# Patient Record
Sex: Female | Born: 1990 | Hispanic: Yes | Marital: Single | Smoking: Never smoker
Health system: Southern US, Community
[De-identification: ages and names within clinical notes are randomized; demographics above are authoritative.]

## PROBLEM LIST (undated history)

## (undated) DIAGNOSIS — Z789 Other specified health status: Secondary | ICD-10-CM

## (undated) HISTORY — PX: NO PAST SURGERIES: SHX2092

---

## 2019-11-03 HISTORY — PX: COLONOSCOPY: SHX174

## 2020-01-18 ENCOUNTER — Encounter (HOSPITAL_COMMUNITY): Payer: Self-pay

## 2020-01-18 ENCOUNTER — Ambulatory Visit (HOSPITAL_COMMUNITY)
Admission: EM | Admit: 2020-01-18 | Discharge: 2020-01-18 | Disposition: A | Discharge status: Home / Self Care | Payer: Self-pay | Attending: Urgent Care | Admitting: Urgent Care

## 2020-01-18 DIAGNOSIS — K5900 Constipation, unspecified: Secondary | ICD-10-CM

## 2020-01-18 DIAGNOSIS — Z3202 Encounter for pregnancy test, result negative: Secondary | ICD-10-CM

## 2020-01-18 DIAGNOSIS — R1032 Left lower quadrant pain: Secondary | ICD-10-CM

## 2020-01-18 LAB — POCT URINALYSIS DIP (DEVICE)
Bilirubin Urine: NEGATIVE
Glucose, UA: NEGATIVE mg/dL
Ketones, ur: NEGATIVE mg/dL
Leukocytes,Ua: NEGATIVE
Nitrite: NEGATIVE
Protein, ur: NEGATIVE mg/dL
Specific Gravity, Urine: 1.015 (ref 1.005–1.030)
Urobilinogen, UA: 0.2 mg/dL (ref 0.0–1.0)
pH: 7 (ref 5.0–8.0)

## 2020-01-18 LAB — POC URINE PREG, ED: Preg Test, Ur: NEGATIVE

## 2020-01-18 LAB — POCT PREGNANCY, URINE: Preg Test, Ur: NEGATIVE

## 2020-01-18 MED ORDER — DOCUSATE SODIUM 100 MG PO CAPS: 100.0000 mg | ORAL_CAPSULE | Freq: Two times a day (BID) | ORAL | 0 refills | Status: DC

## 2020-01-18 NOTE — ED Triage Notes (Signed)
Complains of abdominal pain on the left side for two days

## 2020-01-18 NOTE — ED Provider Notes (Signed)
[facility]   MRN: [id number] DOB: 08/18/1991  Subjective:   Amber Torres is a 29 y.o. female presenting for 2-day history of recurrent intermittent left-sided abdominal pain.  Patient states that she has difficulty with constipation, normally defecates 1-2 times a week.  Admits that she does not eat fiber, practices a carb heavy diet.  She does hydrate well.  Denies ROS as below.  Last bowel movement was this morning, was a hard stool and patient had to strain.  She is not currently taking any medications and has no known food or drug allergies.  Denies past medical and surgical history.  No family history on file.  Social History   Tobacco Use  . Smoking status: Never Smoker  . Smokeless tobacco: Never Used  Substance Use Topics  . Alcohol use: Never  . Drug use: Not on file    Review of Systems  Constitutional: Negative for fever and malaise/fatigue.  HENT: Negative for congestion, ear pain, sinus pain and sore throat.   Eyes: Negative for blurred vision, double vision, discharge and redness.  Respiratory: Negative for cough, hemoptysis, shortness of breath and wheezing.   Cardiovascular: Negative for chest pain.  Gastrointestinal: Positive for abdominal pain and constipation. Negative for blood in stool, diarrhea, nausea and vomiting.  Genitourinary: Negative for dysuria, flank pain and hematuria.  Musculoskeletal: Negative for myalgias.  Skin: Negative for rash.  Neurological: Negative for dizziness, weakness and headaches.  Psychiatric/Behavioral: Negative for depression and substance abuse.     Objective:   Vitals: BP 109/76 (BP Location: Right Arm)   Pulse 92   Temp 97.9 F (36.6 C) (Oral)   Resp 16   Wt 116 lb (52.6 kg)   LMP 01/01/2020 (Exact Date)   SpO2 98%   Physical Exam Constitutional:      General: She is not in acute distress.    Appearance: Normal appearance. She is well-developed and normal weight. She is not ill-appearing,  toxic-appearing or diaphoretic.  HENT:     Head: Normocephalic and atraumatic.     Right Ear: External ear normal.     Left Ear: External ear normal.     Nose: Nose normal.     Mouth/Throat:     Mouth: Mucous membranes are moist.     Pharynx: Oropharynx is clear.  Eyes:     General: No scleral icterus.       Right eye: No discharge.        Left eye: No discharge.     Extraocular Movements: Extraocular movements intact.     Pupils: Pupils are equal, round, and reactive to light.  Cardiovascular:     Rate and Rhythm: Normal rate and regular rhythm.     Heart sounds: Normal heart sounds. No murmur. No friction rub. No gallop.   Pulmonary:     Effort: Pulmonary effort is normal. No respiratory distress.     Breath sounds: Normal breath sounds. No stridor. No wheezing, rhonchi or rales.  Abdominal:     General: Bowel sounds are normal. There is no distension.     Palpations: Abdomen is soft. There is no mass.     Tenderness: There is abdominal tenderness (left sided, stool burden palpated). There is no right CVA tenderness, left CVA tenderness, guarding or rebound.  Skin:    General: Skin is warm and dry.     Coloration: Skin is not pale.     Findings: No rash.  Neurological:     General: No focal deficit  present.     Mental Status: She is alert and oriented to person, place, and time.  Psychiatric:        Mood and Affect: Mood normal.        Behavior: Behavior normal.        Thought Content: Thought content normal.        Judgment: Judgment normal.     Results for orders placed or performed during the hospital encounter of 01/18/20 (from the past 24 hour(s))  POCT urinalysis dip (device)     Status: Abnormal   Collection Time: 01/18/20  3:29 PM  Result Value Ref Range   Glucose, UA NEGATIVE NEGATIVE mg/dL   Bilirubin Urine NEGATIVE NEGATIVE   Ketones, ur NEGATIVE NEGATIVE mg/dL   Specific Gravity, Urine 1.015 1.005 - 1.030   Hgb urine dipstick TRACE (A) NEGATIVE   pH 7.0  5.0 - 8.0   Protein, ur NEGATIVE NEGATIVE mg/dL   Urobilinogen, UA 0.2 0.0 - 1.0 mg/dL   Nitrite NEGATIVE NEGATIVE   Leukocytes,Ua NEGATIVE NEGATIVE  POC urine pregnancy     Status: None   Collection Time: 01/18/20  4:30 PM  Result Value Ref Range   Preg Test, Ur NEGATIVE NEGATIVE  Pregnancy, urine POC     Status: None   Collection Time: 01/18/20  4:30 PM  Result Value Ref Range   Preg Test, Ur NEGATIVE NEGATIVE    Assessment and Plan :   1. Constipation, unspecified constipation type   2. Left lower quadrant abdominal pain     Start docusate 100 mg twice daily.  Recommended significant dietary modifications including increased fiber with salads, vegetables and healthy fruits. Counseled patient on potential for adverse effects with medications prescribed/recommended today, ER and return-to-clinic precautions discussed, patient verbalized understanding.    Jaynee Eagles, PA-C 01/18/20 1700

## 2020-10-17 ENCOUNTER — Other Ambulatory Visit: Payer: Self-pay

## 2020-10-17 ENCOUNTER — Ambulatory Visit (HOSPITAL_COMMUNITY)
Admission: EM | Admit: 2020-10-17 | Discharge: 2020-10-17 | Disposition: A | Discharge status: Home / Self Care | Payer: Self-pay

## 2020-10-18 ENCOUNTER — Other Ambulatory Visit: Payer: Self-pay

## 2020-10-18 DIAGNOSIS — Z20822 Contact with and (suspected) exposure to covid-19: Secondary | ICD-10-CM

## 2020-10-22 LAB — NOVEL CORONAVIRUS, NAA: SARS-CoV-2, NAA: NOT DETECTED

## 2021-03-15 ENCOUNTER — Inpatient Hospital Stay (HOSPITAL_COMMUNITY)
Admission: AD | Admit: 2021-03-15 | Discharge: 2021-03-15 | Disposition: A | Discharge status: Home / Self Care | Payer: Managed Care, Other (non HMO) | Attending: Obstetrics & Gynecology | Admitting: Obstetrics & Gynecology

## 2021-03-15 ENCOUNTER — Other Ambulatory Visit: Payer: Self-pay | Admitting: Obstetrics and Gynecology

## 2021-03-15 ENCOUNTER — Inpatient Hospital Stay (HOSPITAL_COMMUNITY): Payer: Managed Care, Other (non HMO)

## 2021-03-15 ENCOUNTER — Other Ambulatory Visit: Payer: Self-pay

## 2021-03-15 ENCOUNTER — Encounter (HOSPITAL_COMMUNITY): Payer: Self-pay | Admitting: Obstetrics & Gynecology

## 2021-03-15 ENCOUNTER — Telehealth: Payer: Self-pay | Admitting: Obstetrics and Gynecology

## 2021-03-15 DIAGNOSIS — Z3A01 Less than 8 weeks gestation of pregnancy: Secondary | ICD-10-CM

## 2021-03-15 DIAGNOSIS — B373 Candidiasis of vulva and vagina: Secondary | ICD-10-CM | POA: Diagnosis not present

## 2021-03-15 DIAGNOSIS — R109 Unspecified abdominal pain: Secondary | ICD-10-CM | POA: Insufficient documentation

## 2021-03-15 DIAGNOSIS — O98811 Other maternal infectious and parasitic diseases complicating pregnancy, first trimester: Secondary | ICD-10-CM

## 2021-03-15 DIAGNOSIS — B9689 Other specified bacterial agents as the cause of diseases classified elsewhere: Secondary | ICD-10-CM

## 2021-03-15 DIAGNOSIS — Z789 Other specified health status: Secondary | ICD-10-CM | POA: Diagnosis not present

## 2021-03-15 DIAGNOSIS — O3680X0 Pregnancy with inconclusive fetal viability, not applicable or unspecified: Secondary | ICD-10-CM | POA: Diagnosis not present

## 2021-03-15 DIAGNOSIS — O26891 Other specified pregnancy related conditions, first trimester: Secondary | ICD-10-CM | POA: Insufficient documentation

## 2021-03-15 DIAGNOSIS — N760 Acute vaginitis: Secondary | ICD-10-CM

## 2021-03-15 DIAGNOSIS — B3731 Acute candidiasis of vulva and vagina: Secondary | ICD-10-CM

## 2021-03-15 HISTORY — DX: Other specified health status: Z78.9

## 2021-03-15 LAB — CBC
HCT: 36.8 % (ref 36.0–46.0)
Hemoglobin: 12.6 g/dL (ref 12.0–15.0)
MCH: 31 pg (ref 26.0–34.0)
MCHC: 34.2 g/dL (ref 30.0–36.0)
MCV: 90.6 fL (ref 80.0–100.0)
Platelets: 274 10*3/uL (ref 150–400)
RBC: 4.06 MIL/uL (ref 3.87–5.11)
RDW: 12.9 % (ref 11.5–15.5)
WBC: 7.3 10*3/uL (ref 4.0–10.5)
nRBC: 0 % (ref 0.0–0.2)

## 2021-03-15 LAB — HIV ANTIBODY (ROUTINE TESTING W REFLEX): HIV Screen 4th Generation wRfx: NONREACTIVE

## 2021-03-15 LAB — WET PREP, GENITAL
Sperm: NONE SEEN
Trich, Wet Prep: NONE SEEN
Yeast Wet Prep HPF POC: NONE SEEN

## 2021-03-15 LAB — POCT PREGNANCY, URINE: Preg Test, Ur: POSITIVE — AB

## 2021-03-15 LAB — HCG, QUANTITATIVE, PREGNANCY: hCG, Beta Chain, Quant, S: 374 m[IU]/mL — ABNORMAL HIGH (ref <5)

## 2021-03-15 MED ORDER — TERCONAZOLE 0.4 % VA CREA: 1.0000 | TOPICAL_CREAM | Freq: Every day | VAGINAL | 0 refills | Status: AC

## 2021-03-15 MED ORDER — METRONIDAZOLE 500 MG PO TABS: 500.0000 mg | ORAL_TABLET | Freq: Two times a day (BID) | ORAL | 0 refills | Status: DC

## 2021-03-15 NOTE — MAU Provider Note (Signed)
History     CSN: [id number]  Arrival date and time: 03/15/21 1443   Event Date/Time   First Provider Initiated Contact with Patient 03/15/21 1901      Chief Complaint  Patient presents with  . Abdominal Pain  . Possible Pregnancy   [name] is a 30 y.o. year old G1P0 female at [redacted]w[redacted]d weeks gestation who presents to MAU reporting left lower abdominal pain in the setting of 1 (+) and 1 (-) HPT. She denies any vaginal bleeding. She wants to confirm pregnancy. She has not had any PNC at this time. Her spouse, Jonetta Speak, is present and contributing to the history taking; he is also her interpreter (waiver signed by patient).   OB History    Gravida  1   Para      Term      Preterm      AB      Living        SAB      IAB      Ectopic      Multiple      Live Births              Past Medical History:  Diagnosis Date  . Medical history non-contributory     Past Surgical History:  Procedure Laterality Date  . NO PAST SURGERIES      History reviewed. No pertinent family history.  Social History   Tobacco Use  . Smoking status: Never Smoker  . Smokeless tobacco: Never Used  Vaping Use  . Vaping Use: Never used  Substance Use Topics  . Alcohol use: Never  . Drug use: Never    Allergies: No Known Allergies  No medications prior to admission.    Review of Systems  Constitutional: Negative.   HENT: Negative.   Eyes: Negative.   Respiratory: Negative.   Cardiovascular: Negative.   Gastrointestinal: Negative.   Endocrine: Negative.   Genitourinary: Positive for pelvic pain (LLQ). Negative for vaginal bleeding.  Musculoskeletal: Negative.   Skin: Negative.   Allergic/Immunologic: Negative.   Neurological: Negative.   Hematological: Negative.   Psychiatric/Behavioral: Negative.    Physical Exam   Blood pressure 92/76, pulse 71, temperature 98.2 F (36.8 C), temperature source Oral, resp. rate 15, weight 53.8 kg, last menstrual period  02/03/2021, SpO2 100 %.  Physical Exam Vitals and nursing note reviewed. Exam conducted with a chaperone present.  Constitutional:      Appearance: Normal appearance. She is normal weight.  Cardiovascular:     Rate and Rhythm: Normal rate.  Pulmonary:     Effort: Pulmonary effort is normal.  Abdominal:     General: Abdomen is flat.     Palpations: Abdomen is soft.  Genitourinary:    General: Normal vulva.     Comments: Pelvic exam: External genitalia normal, SE: vaginal walls pink and well rugated, cervix is smooth, pink, no lesions, moderate amt of thick, curdy, with watery white vaginal d/c -- WP, GC/CT done, cervix visually closed, Uterus is non-tender, no CMT or friability, mild LT adnexal tenderness.  Musculoskeletal:     Cervical back: Normal range of motion.  Neurological:     Mental Status: She is alert and oriented to person, place, and time.  Psychiatric:        Mood and Affect: Mood normal.        Behavior: Behavior normal.        Thought Content: Thought content normal.  Judgment: Judgment normal.    MAU Course  Procedures  MDM [facility]UA UPT CBC ABO/Rh HCG Wet Prep GC/CT -- pending HIV -- pending OB < 14 wks [place] with TV  Results for orders placed or performed during the hospital encounter of 03/15/21 (from the past 24 hour(s))  Pregnancy, urine POC     Status: Abnormal   Collection Time: 03/15/21  4:01 PM  Result Value Ref Range   Preg Test, Ur POSITIVE (A) NEGATIVE  Wet prep, genital     Status: Abnormal   Collection Time: 03/15/21  4:45 PM   Specimen: Vaginal  Result Value Ref Range   Yeast Wet Prep HPF POC NONE SEEN NONE SEEN   Trich, Wet Prep NONE SEEN NONE SEEN   Clue Cells Wet Prep HPF POC PRESENT (A) NONE SEEN   WBC, Wet Prep HPF POC MANY (A) NONE SEEN   Sperm NONE SEEN   HIV Antibody (routine testing w rflx)     Status: None   Collection Time: 03/15/21  5:07 PM  Result Value Ref Range   HIV Screen 4th Generation wRfx Non Reactive Non  Reactive  CBC     Status: None   Collection Time: 03/15/21  5:07 PM  Result Value Ref Range   WBC 7.3 4.0 - 10.5 K/uL   RBC 4.06 3.87 - 5.11 MIL/uL   Hemoglobin 12.6 12.0 - 15.0 g/dL   HCT 16.9 67.8 - 93.8 %   MCV 90.6 80.0 - 100.0 fL   MCH 31.0 26.0 - 34.0 pg   MCHC 34.2 30.0 - 36.0 g/dL   RDW 10.1 75.1 - 02.5 %   Platelets 274 150 - 400 K/uL   nRBC 0.0 0.0 - 0.2 %  ABO/Rh     Status: None   Collection Time: 03/15/21  5:07 PM  Result Value Ref Range   ABO/RH(D)      A POS Performed at [facility], [address] [place], [place] [address]   hCG, quantitative, pregnancy     Status: Abnormal   Collection Time: 03/15/21  5:09 PM  Result Value Ref Range   hCG, Beta Chain, Quant, S 374 (H) <5 mIU/mL    US OB LESS THAN 14 WEEKS WITH OB TRANSVAGINAL  Result Date: 03/15/2021 CLINICAL DATA:  Abdominal pain without vaginal bleeding, quantitative beta hCG of 374 EXAM: OBSTETRIC <14 WK ULTRASOUND TECHNIQUE: Transabdominal ultrasound was performed for evaluation of the gestation as well as the maternal uterus and adnexal regions. COMPARISON:  None. FINDINGS: Intrauterine gestational sac: No definite gestational sac visualized. However, there is a tiny cystic structure within the endometrium measuring approximately 1.2 mm. Yolk sac:  Not Visualized. Embryo:  Not Visualized. Cardiac Activity: Not Visualized. Subchorionic hemorrhage:  None visualized. Maternal uterus/adnexae: Small volume of simple appearing pelvic free fluid. IMPRESSION: Pregnancy of unknown location. In hemodynamically stable patient recommend short-term interval follow-up with pelvic ultrasound and serial beta HCG monitoring. Electronically Signed   By: Maudry Mayhew MD   On: 03/15/2021 20:37     Assessment and Plan  Abdominal pain during pregnancy in first trimester   Language barrier affecting health care - Spanish Interpreter, Suzette Battiest, spouse used for entire visit   Pregnancy of unknown anatomic  location  Candida vaginitis - Rx for Terazol vaginal cream hs x 7 days  - Discharge home - Repeat HCG on 03/18/21 at [facility] as scheduled - Patient verbalized an understanding of the plan of care and agrees.   Amber Torres Mora, CNM  03/15/2021, 7:01 PM

## 2021-03-15 NOTE — Telephone Encounter (Signed)
TC spoke to patient's husband, [place], [place] verified by DOB. Notified of BV results and will send Rx for BV and yeast to pharmacy on file, [facility] [address] He requested that pharmacy be changed to [facility] on [address]. Spouse verbalized an understanding of the plan of care and agrees.   [name] Mora, CNM

## 2021-03-15 NOTE — Discharge Instructions (Signed)
NO SEX UNTIL YOU ARE TOLD YOU CAN HAVE SEX

## 2021-03-15 NOTE — MAU Note (Signed)
Pt reports to mau with c/o lower left abd pain.  Reports pos and neg hpt. Denies vag bleeding

## 2021-03-17 LAB — ABO/RH: ABO/RH(D): A POS

## 2021-03-17 LAB — GC/CHLAMYDIA PROBE AMP (~~LOC~~) NOT AT ARMC
Chlamydia: NEGATIVE
Comment: NEGATIVE
Comment: NORMAL
Neisseria Gonorrhea: NEGATIVE

## 2021-03-18 ENCOUNTER — Ambulatory Visit: Payer: Managed Care, Other (non HMO)

## 2021-03-18 ENCOUNTER — Other Ambulatory Visit: Payer: Self-pay

## 2021-03-18 VITALS — BP 106/70 | HR 74

## 2021-03-18 DIAGNOSIS — O3680X0 Pregnancy with inconclusive fetal viability, not applicable or unspecified: Secondary | ICD-10-CM

## 2021-03-18 NOTE — Progress Notes (Signed)
Patient presented to the office today for a stat hcg check. Patient does not report any abnormal pain at this time. Her last cycle was around 02/03/2021. Patient lab drawn and will be contacted with test results once they are resulted.   HCG: 03/15/2021-374  TC to patient to regarding test results and recommendations. Patient will have an u/s in 10-14 days to determine dating. Ectopic precaution given to patient.

## 2021-03-19 LAB — BETA HCG QUANT (REF LAB): hCG Quant: 1383 m[IU]/mL

## 2021-03-20 ENCOUNTER — Telehealth: Payer: Self-pay

## 2021-03-20 DIAGNOSIS — Z3401 Encounter for supervision of normal first pregnancy, first trimester: Secondary | ICD-10-CM

## 2021-03-20 NOTE — Telephone Encounter (Signed)
Order place for ultrasound in 10-14 days.

## 2021-04-03 ENCOUNTER — Ambulatory Visit
Admission: RE | Admit: 2021-04-03 | Discharge: 2021-04-03 | Disposition: A | Discharge status: Home / Self Care | Payer: Managed Care, Other (non HMO) | Source: Ambulatory Visit | Attending: Obstetrics and Gynecology | Admitting: Obstetrics and Gynecology

## 2021-04-03 ENCOUNTER — Other Ambulatory Visit: Payer: Self-pay

## 2021-04-03 ENCOUNTER — Telehealth: Payer: Self-pay | Admitting: Medical

## 2021-04-03 ENCOUNTER — Other Ambulatory Visit: Payer: Self-pay | Admitting: Obstetrics and Gynecology

## 2021-04-03 DIAGNOSIS — Z3401 Encounter for supervision of normal first pregnancy, first trimester: Secondary | ICD-10-CM | POA: Insufficient documentation

## 2021-04-03 NOTE — Telephone Encounter (Signed)
I called Amber Torres today at 3:14 PM and confirmed patient's identity using two patient identifiers with interpreter, Eda Royal. [place] results from earlier today were reviewed. Patient is scheduled for new OB visit at [facility]. First trimester warning signs reviewed. Patient voiced understanding and had no further questions.   US OB Comp Less 14 Wks  Result Date: 04/03/2021 CLINICAL DATA:  First trimester pregnancy, assessment of viability and gestational age, uncertain LMP EXAM: OBSTETRIC <14 WK ULTRASOUND TECHNIQUE: Transabdominal ultrasound was performed for evaluation of the gestation as well as the maternal uterus and adnexal regions. COMPARISON:  01/13/2021 FINDINGS: Intrauterine gestational sac: Present, single Yolk sac:  Present Embryo:  Present Cardiac Activity: Present Heart Rate: 125 bpm CRL:   7.3 mm   6 w 4 d                  [place] EDC: 11/23/2021 Subchorionic hemorrhage:  None visualized. Maternal uterus/adnexae: Maternal uterus otherwise normal appearance. RIGHT ovary normal size and morphology 3.6 x 2.3 x 1.7 cm. LEFT ovary normal size and morphology 3.0 x 2.5 x 1.6 cm. No free pelvic fluid.  No adnexal masses. IMPRESSION: Single live intrauterine gestation at 6 weeks 4 days EGA by crown-rump length. No acute abnormalities. Electronically Signed   By: Ulyses Southward M.D.   On: 04/03/2021 12:23    Marny Lowenstein, PA-C 04/03/2021 3:14 PM

## 2021-05-01 LAB — VITAMIN D 25-HYDROXY, D2 + D3: Vitamin D, 25 Hydroxy RIA: 28.7

## 2021-05-01 LAB — HEPATITIS C ANTIBODY: HCV Ab: NEGATIVE

## 2021-05-01 LAB — OB RESULTS CONSOLE HEPATITIS B SURFACE ANTIGEN: Hepatitis B Surface Ag: NEGATIVE

## 2021-05-01 LAB — OB RESULTS CONSOLE ABO/RH: RH Type: POSITIVE

## 2021-05-01 LAB — OB RESULTS CONSOLE ANTIBODY SCREEN: Antibody Screen: NEGATIVE

## 2021-05-01 LAB — OB RESULTS CONSOLE HGB/HCT, BLOOD
HCT: 38 (ref 29–41)
Hemoglobin: 12.9

## 2021-05-01 LAB — OB RESULTS CONSOLE RUBELLA ANTIBODY, IGM: Rubella: IMMUNE

## 2021-05-01 LAB — OB RESULTS CONSOLE HIV ANTIBODY (ROUTINE TESTING): HIV: NONREACTIVE

## 2021-05-01 LAB — OB RESULTS CONSOLE GC/CHLAMYDIA
Chlamydia: NEGATIVE
Gonorrhea: NEGATIVE

## 2021-05-01 LAB — OB RESULTS CONSOLE PLATELET COUNT: Platelets: 243

## 2021-05-01 LAB — HEMOGLOBIN A1C: Hemoglobin A1C: 5.5

## 2021-05-01 LAB — SICKLE CELL SCREEN: Sickle Cell Screen: NORMAL

## 2021-05-12 LAB — CYSTIC FIBROSIS DIAGNOSTIC STUDY: Interpretation-CFDNA:: NEGATIVE

## 2021-07-14 ENCOUNTER — Encounter (HOSPITAL_COMMUNITY): Payer: Self-pay | Admitting: Obstetrics & Gynecology

## 2021-07-14 ENCOUNTER — Inpatient Hospital Stay (HOSPITAL_COMMUNITY): Payer: Self-pay

## 2021-07-14 ENCOUNTER — Inpatient Hospital Stay (HOSPITAL_COMMUNITY)
Admission: AD | Admit: 2021-07-14 | Discharge: 2021-07-14 | Disposition: A | Discharge status: Home / Self Care | Payer: Self-pay | Attending: Obstetrics & Gynecology | Admitting: Obstetrics & Gynecology

## 2021-07-14 ENCOUNTER — Other Ambulatory Visit: Payer: Self-pay

## 2021-07-14 DIAGNOSIS — Z20822 Contact with and (suspected) exposure to covid-19: Secondary | ICD-10-CM | POA: Insufficient documentation

## 2021-07-14 DIAGNOSIS — Z3689 Encounter for other specified antenatal screening: Secondary | ICD-10-CM

## 2021-07-14 DIAGNOSIS — Z3A23 23 weeks gestation of pregnancy: Secondary | ICD-10-CM | POA: Insufficient documentation

## 2021-07-14 DIAGNOSIS — R059 Cough, unspecified: Secondary | ICD-10-CM

## 2021-07-14 DIAGNOSIS — O99352 Diseases of the nervous system complicating pregnancy, second trimester: Secondary | ICD-10-CM | POA: Insufficient documentation

## 2021-07-14 DIAGNOSIS — O26892 Other specified pregnancy related conditions, second trimester: Secondary | ICD-10-CM | POA: Insufficient documentation

## 2021-07-14 DIAGNOSIS — O99891 Other specified diseases and conditions complicating pregnancy: Secondary | ICD-10-CM

## 2021-07-14 DIAGNOSIS — R058 Other specified cough: Secondary | ICD-10-CM | POA: Insufficient documentation

## 2021-07-14 DIAGNOSIS — G44209 Tension-type headache, unspecified, not intractable: Secondary | ICD-10-CM | POA: Insufficient documentation

## 2021-07-14 LAB — COMPREHENSIVE METABOLIC PANEL
ALT: 20 U/L (ref 0–44)
AST: 25 U/L (ref 15–41)
Albumin: 3.1 g/dL — ABNORMAL LOW (ref 3.5–5.0)
Alkaline Phosphatase: 48 U/L (ref 38–126)
Anion gap: 10 (ref 5–15)
BUN: 7 mg/dL (ref 6–20)
CO2: 24 mmol/L (ref 22–32)
Calcium: 8.8 mg/dL — ABNORMAL LOW (ref 8.9–10.3)
Chloride: 102 mmol/L (ref 98–111)
Creatinine, Ser: 0.52 mg/dL (ref 0.44–1.00)
GFR, Estimated: >60 mL/min (ref >60)
Glucose, Bld: 107 mg/dL — ABNORMAL HIGH (ref 70–99)
Potassium: 3.9 mmol/L (ref 3.5–5.1)
Sodium: 136 mmol/L (ref 135–145)
Total Bilirubin: 0.2 mg/dL — ABNORMAL LOW (ref 0.3–1.2)
Total Protein: 6.6 g/dL (ref 6.5–8.1)

## 2021-07-14 LAB — URINALYSIS, MICROSCOPIC (REFLEX)

## 2021-07-14 LAB — CBC WITH DIFFERENTIAL/PLATELET
Abs Immature Granulocytes: 0.07 10*3/uL (ref 0.00–0.07)
Basophils Absolute: 0 10*3/uL (ref 0.0–0.1)
Basophils Relative: 0 %
Eosinophils Absolute: 0.2 10*3/uL (ref 0.0–0.5)
Eosinophils Relative: 2 %
HCT: 37.4 % (ref 36.0–46.0)
Hemoglobin: 12.8 g/dL (ref 12.0–15.0)
Immature Granulocytes: 1 %
Lymphocytes Relative: 14 %
Lymphs Abs: 1.6 10*3/uL (ref 0.7–4.0)
MCH: 32.3 pg (ref 26.0–34.0)
MCHC: 34.2 g/dL (ref 30.0–36.0)
MCV: 94.4 fL (ref 80.0–100.0)
Monocytes Absolute: 0.8 10*3/uL (ref 0.1–1.0)
Monocytes Relative: 7 %
Neutro Abs: 9.2 10*3/uL — ABNORMAL HIGH (ref 1.7–7.7)
Neutrophils Relative %: 76 %
Platelets: 234 10*3/uL (ref 150–400)
RBC: 3.96 MIL/uL (ref 3.87–5.11)
RDW: 13.6 % (ref 11.5–15.5)
WBC: 12 10*3/uL — ABNORMAL HIGH (ref 4.0–10.5)
nRBC: 0 % (ref 0.0–0.2)

## 2021-07-14 LAB — URINALYSIS, ROUTINE W REFLEX MICROSCOPIC
Bilirubin Urine: NEGATIVE
Glucose, UA: NEGATIVE mg/dL
Hgb urine dipstick: NEGATIVE
Ketones, ur: NEGATIVE mg/dL
Nitrite: NEGATIVE
Protein, ur: NEGATIVE mg/dL
Specific Gravity, Urine: 1.015 (ref 1.005–1.030)
pH: 7 (ref 5.0–8.0)

## 2021-07-14 LAB — RESP PANEL BY RT-PCR (FLU A&B, COVID) ARPGX2
Influenza A by PCR: NEGATIVE
Influenza B by PCR: NEGATIVE
SARS Coronavirus 2 by RT PCR: NEGATIVE

## 2021-07-14 MED ORDER — BENZONATATE 100 MG PO CAPS: 200.0000 mg | ORAL_CAPSULE | Freq: Three times a day (TID) | ORAL | 0 refills | Status: DC | PRN

## 2021-07-14 MED ORDER — BUTALBITAL-APAP-CAFFEINE 50-325-40 MG PO TABS
2.0000 | ORAL_TABLET | Freq: Once | ORAL | Status: AC
  Administered 2021-07-14: 2 via ORAL
  Filled 2021-07-14: qty 2

## 2021-07-14 MED ORDER — GUAIFENESIN 100 MG/5ML PO SOLN: 5.0000 mL | ORAL | 0 refills | Status: DC | PRN

## 2021-07-14 MED ORDER — GUAIFENESIN 100 MG/5ML PO SOLN
5.0000 mL | ORAL | Status: DC | PRN
  Administered 2021-07-14: 100 mg via ORAL
  Filled 2021-07-14: qty 15

## 2021-07-14 NOTE — MAU Note (Addendum)
Headache, Coughing up yellow mucus, sore eyes, stuffy nose, Sore throat, and skin warm to touch since Friday afternoon. Baby is not moving a lot since Saturday.  Took Tylenol.  No bleeding. No abd pain. No leaking.

## 2021-07-14 NOTE — MAU Provider Note (Signed)
History     CSN: [id number]  Arrival date and time: 07/14/21 0022   Event Date/Time   First Provider Initiated Contact with Patient 07/14/21 0104      Chief Complaint  Patient presents with   URI   Headache   HPI  [name] is a 30 y.o. female G1P0 @ [redacted]w[redacted]d here in MAU with complaints of Covid Symptoms that started Friday afternoon (3 days). She reports headache, fever, sore throat, and cough. She did not check her temperature at home, however has felt very hot off and on. She has tried tylenol for the fever. She reports it is not helping her symptoms. She has been vaccinated against Covid 19. She has no sick contacts. She has not tried anything over the counter for cough. No one else in the home is sick. She has decreased appetite however is able to keep down water.    OB History     Gravida  1   Para      Term      Preterm      AB      Living         SAB      IAB      Ectopic      Multiple      Live Births              Past Medical History:  Diagnosis Date   Medical history non-contributory     Past Surgical History:  Procedure Laterality Date   NO PAST SURGERIES      History reviewed. No pertinent family history.  Social History   Tobacco Use   Smoking status: Never   Smokeless tobacco: Never  Vaping Use   Vaping Use: Never used  Substance Use Topics   Alcohol use: Never   Drug use: Never    Allergies: No Known Allergies  Medications Prior to Admission  Medication Sig Dispense Refill Last Dose   Prenatal Vit-Fe Fumarate-FA (MULTIVITAMIN-PRENATAL) 27-0.8 MG TABS tablet Take 1 tablet by mouth daily at 12 noon.   07/13/2021   docusate sodium (COLACE) 100 MG capsule Take 1 capsule (100 mg total) by mouth every 12 (twelve) hours. 60 capsule 0    metroNIDAZOLE (FLAGYL) 500 MG tablet Take 1 tablet (500 mg total) by mouth 2 (two) times daily. 14 tablet 0    Recent Results (from the past 2160 hour(s))  Resp Panel by RT-PCR (Flu A&B,  Covid) Nasopharyngeal Swab     Status: None   Collection Time: 07/14/21  1:10 AM   Specimen: Nasopharyngeal Swab; Nasopharyngeal(NP) swabs in vial transport medium  Result Value Ref Range   SARS Coronavirus 2 by RT PCR NEGATIVE NEGATIVE    Comment: (NOTE) SARS-CoV-2 target nucleic acids are NOT DETECTED.  The SARS-CoV-2 RNA is generally detectable in upper respiratory specimens during the acute phase of infection. The lowest concentration of SARS-CoV-2 viral copies this assay can detect is 138 copies/mL. A negative result does not preclude SARS-Cov-2 infection and should not be used as the sole basis for treatment or other patient management decisions. A negative result may occur with  improper specimen collection/handling, submission of specimen other than nasopharyngeal swab, presence of viral mutation(s) within the areas targeted by this assay, and inadequate number of viral copies(<138 copies/mL). A negative result must be combined with clinical observations, patient history, and epidemiological information. The expected result is Negative.  Fact Sheet for Patients:  BloggerCourse.com  Fact Sheet for Healthcare Providers:  SeriousBroker.it  This test is no t yet approved or cleared by the [place] and  has been authorized for detection and/or diagnosis of SARS-CoV-2 by FDA under an Emergency Use Authorization (EUA). This EUA will remain  in effect (meaning this test can be used) for the duration of the COVID-19 declaration under Section 564(b)(1) of the Act, 21 U.S.C.section 360bbb-3(b)(1), unless the authorization is terminated  or revoked sooner.       Influenza A by PCR NEGATIVE NEGATIVE   Influenza B by PCR NEGATIVE NEGATIVE    Comment: (NOTE) The Xpert Xpress SARS-CoV-2/FLU/RSV plus assay is intended as an aid in the diagnosis of influenza from Nasopharyngeal swab specimens and should not be used as a sole  basis for treatment. Nasal washings and aspirates are unacceptable for Xpert Xpress SARS-CoV-2/FLU/RSV testing.  Fact Sheet for Patients: BloggerCourse.com  Fact Sheet for Healthcare Providers: SeriousBroker.it  This test is not yet approved or cleared by the [place] FDA and has been authorized for detection and/or diagnosis of SARS-CoV-2 by FDA under an Emergency Use Authorization (EUA). This EUA will remain in effect (meaning this test can be used) for the duration of the COVID-19 declaration under Section 564(b)(1) of the Act, 21 U.S.C. section 360bbb-3(b)(1), unless the authorization is terminated or revoked.  Performed at [facility], [address], [place], [place] [address]   Urinalysis, Routine w reflex microscopic Urine, Clean Catch     Status: Abnormal   Collection Time: 07/14/21  1:48 AM  Result Value Ref Range   Color, Urine YELLOW YELLOW   APPearance CLEAR CLEAR   Specific Gravity, Urine 1.015 1.005 - 1.030   pH 7.0 5.0 - 8.0   Glucose, UA NEGATIVE NEGATIVE mg/dL   Hgb urine dipstick NEGATIVE NEGATIVE   Bilirubin Urine NEGATIVE NEGATIVE   Ketones, ur NEGATIVE NEGATIVE mg/dL   Protein, ur NEGATIVE NEGATIVE mg/dL   Nitrite NEGATIVE NEGATIVE   Leukocytes,Ua SMALL (A) NEGATIVE    Comment: Performed at [facility], [address], [place], [place] [address]  Urinalysis, Microscopic (reflex)     Status: Abnormal   Collection Time: 07/14/21  1:48 AM  Result Value Ref Range   RBC / HPF 0-5 0 - 5 RBC/hpf   WBC, UA 0-5 0 - 5 WBC/hpf   Bacteria, UA RARE (A) NONE SEEN   Squamous Epithelial / LPF 0-5 0 - 5    Comment: Performed at [facility], [address] [place], [place] [address]  CBC with Differential/Platelet     Status: Abnormal   Collection Time: 07/14/21  1:52 AM  Result Value Ref Range   WBC 12.0 (H) 4.0 - 10.5 K/uL   RBC 3.96 3.87 - 5.11 MIL/uL   Hemoglobin 12.8 12.0 - 15.0 g/dL    HCT 95.0 93.2 - 67.1 %   MCV 94.4 80.0 - 100.0 fL   MCH 32.3 26.0 - 34.0 pg   MCHC 34.2 30.0 - 36.0 g/dL   RDW 24.5 80.9 - 98.3 %   Platelets 234 150 - 400 K/uL   nRBC 0.0 0.0 - 0.2 %   Neutrophils Relative % 76 %   Neutro Abs 9.2 (H) 1.7 - 7.7 K/uL   Lymphocytes Relative 14 %   Lymphs Abs 1.6 0.7 - 4.0 K/uL   Monocytes Relative 7 %   Monocytes Absolute 0.8 0.1 - 1.0 K/uL   Eosinophils Relative 2 %   Eosinophils Absolute 0.2 0.0 - 0.5 K/uL  Basophils Relative 0 %   Basophils Absolute 0.0 0.0 - 0.1 K/uL   Immature Granulocytes 1 %   Abs Immature Granulocytes 0.07 0.00 - 0.07 K/uL    Comment: Performed at [facility], [address] [place], [place] [address]  Comprehensive metabolic panel     Status: Abnormal   Collection Time: 07/14/21  1:52 AM  Result Value Ref Range   Sodium 136 135 - 145 mmol/L   Potassium 3.9 3.5 - 5.1 mmol/L   Chloride 102 98 - 111 mmol/L   CO2 24 22 - 32 mmol/L   Glucose, Bld 107 (H) 70 - 99 mg/dL    Comment: Glucose reference range applies only to samples taken after fasting for at least 8 hours.   BUN 7 6 - 20 mg/dL   Creatinine, Ser 4.68 0.44 - 1.00 mg/dL   Calcium 8.8 (L) 8.9 - 10.3 mg/dL   Total Protein 6.6 6.5 - 8.1 g/dL   Albumin 3.1 (L) 3.5 - 5.0 g/dL   AST 25 15 - 41 U/L   ALT 20 0 - 44 U/L   Alkaline Phosphatase 48 38 - 126 U/L   Total Bilirubin 0.2 (L) 0.3 - 1.2 mg/dL   GFR, Estimated >03 >21 mL/min    Comment: (NOTE) Calculated using the CKD-EPI Creatinine Equation (2021)    Anion gap 10 5 - 15    Comment: Performed at [facility], [address], [place], [place] [address]     Review of Systems  Constitutional:  Positive for chills and fever.  HENT:  Positive for congestion and sore throat.   Respiratory:  Positive for cough.   Cardiovascular:  Positive for chest pain (only with cough).  Neurological:  Positive for headaches.  Physical Exam   Blood pressure 111/68, pulse 88, temperature 98.4 F (36.9 C),  temperature source Oral, resp. rate 16, weight 57.6 kg, last menstrual period 02/03/2021, SpO2 100 %.  Physical Exam Vitals and nursing note reviewed.  Constitutional:      General: She is not in acute distress.    Appearance: She is well-developed. She is not ill-appearing, toxic-appearing or diaphoretic.  HENT:     Head: Normocephalic.  Eyes:     Pupils: Pupils are equal, round, and reactive to light.  Cardiovascular:     Rate and Rhythm: Normal rate.  Pulmonary:     Effort: Pulmonary effort is normal.     Breath sounds: Normal breath sounds. No wheezing or rhonchi.  Abdominal:     Palpations: Abdomen is soft.  Skin:    General: Skin is warm.  Neurological:     Mental Status: She is alert and oriented to person, place, and time.  Psychiatric:        Behavior: Behavior normal.   Fetal Tracing: Baseline: 145 bpm Variability: moderate  Accelerations: 10x10 Decelerations: None Toco: None  MAU Course  Procedures None   MDM  Covid negative  Chest Xray normal CMP and CBC reassuring.  UA collected Covid negative Fioricet given 2 tablets  Robitussin given   Assessment and Plan   A:  1. Tension-type headache, not intractable, unspecified chronicity pattern   2. Cough   3. [redacted] weeks gestation of pregnancy   4. NST (non-stress test) reactive      P:  Discharge home in stable condition Return to MAU if symptoms worsen Increase oral fluid intake Rx: Robitussin, tessalon pearls  REST   Intisar Claudio, Harolyn Rutherford, NP 07/16/2021 2:33 PM

## 2021-07-14 NOTE — Discharge Instructions (Signed)

## 2021-08-27 LAB — OB RESULTS CONSOLE HGB/HCT, BLOOD
HCT: 36 (ref 29–41)
Hemoglobin: 12.2

## 2021-08-27 LAB — GLUCOSE, 1 HOUR GESTATIONAL: Glucose Tolerance, 1 hour: 123

## 2021-08-27 LAB — OB RESULTS CONSOLE PLATELET COUNT: Platelets: 230

## 2021-08-27 LAB — OB RESULTS CONSOLE HIV ANTIBODY (ROUTINE TESTING): HIV: NONREACTIVE

## 2021-08-27 LAB — OB RESULTS CONSOLE RPR: RPR: NONREACTIVE

## 2021-09-27 ENCOUNTER — Encounter (HOSPITAL_COMMUNITY): Payer: Self-pay | Admitting: Obstetrics and Gynecology

## 2021-09-27 ENCOUNTER — Other Ambulatory Visit: Payer: Self-pay

## 2021-09-27 ENCOUNTER — Inpatient Hospital Stay (HOSPITAL_COMMUNITY)
Admission: AD | Admit: 2021-09-27 | Discharge: 2021-09-27 | Disposition: A | Discharge status: Home / Self Care | Payer: Self-pay | Attending: Obstetrics and Gynecology | Admitting: Obstetrics and Gynecology

## 2021-09-27 DIAGNOSIS — Z3A33 33 weeks gestation of pregnancy: Secondary | ICD-10-CM | POA: Insufficient documentation

## 2021-09-27 DIAGNOSIS — O99513 Diseases of the respiratory system complicating pregnancy, third trimester: Secondary | ICD-10-CM | POA: Insufficient documentation

## 2021-09-27 DIAGNOSIS — O98513 Other viral diseases complicating pregnancy, third trimester: Secondary | ICD-10-CM | POA: Insufficient documentation

## 2021-09-27 DIAGNOSIS — Z20822 Contact with and (suspected) exposure to covid-19: Secondary | ICD-10-CM | POA: Insufficient documentation

## 2021-09-27 DIAGNOSIS — J069 Acute upper respiratory infection, unspecified: Secondary | ICD-10-CM

## 2021-09-27 DIAGNOSIS — J101 Influenza due to other identified influenza virus with other respiratory manifestations: Secondary | ICD-10-CM | POA: Insufficient documentation

## 2021-09-27 LAB — URINALYSIS, ROUTINE W REFLEX MICROSCOPIC
Bilirubin Urine: NEGATIVE
Glucose, UA: NEGATIVE mg/dL
Hgb urine dipstick: NEGATIVE
Ketones, ur: NEGATIVE mg/dL
Leukocytes,Ua: NEGATIVE
Nitrite: NEGATIVE
Protein, ur: NEGATIVE mg/dL
Specific Gravity, Urine: 1.001 — ABNORMAL LOW (ref 1.005–1.030)
pH: 7 (ref 5.0–8.0)

## 2021-09-27 LAB — RESP PANEL BY RT-PCR (FLU A&B, COVID) ARPGX2
Influenza A by PCR: POSITIVE — AB
Influenza B by PCR: NEGATIVE
SARS Coronavirus 2 by RT PCR: NEGATIVE

## 2021-09-27 NOTE — MAU Note (Signed)
Pt states she took tylenol today at 1200 and 1800.

## 2021-09-27 NOTE — MAU Note (Addendum)
Amber Torres is a 30 y.o. at [redacted]w[redacted]d here in MAU reporting: fever, body aches and cough. That started last night at 2200.Pt has not tested for flu or covid. Pt states coughing spells make her short of breath. Pt states she is able to eat and drink. Pts husband gave her dayquil at 1800. Pt has no OB complaints-denies SROM, vaginal bleeding or discharge, denies uterine cramping or contractions, states baby is less active during the day and more active at night. [employer] used for all information  Pain score: 8-bones-generalized Vitals:   09/27/21 1956  BP: (!) 102/47  Pulse: (!) 123  Resp: 18  Temp: 98 F (36.7 C)     FHT: Lab orders placed from triage:  covid, ua

## 2021-09-27 NOTE — MAU Provider Note (Signed)
History    [id number]  Arrival date and time: 09/27/21 1940  Chief Complaint  Patient presents with   Fever   Cough   Generalized Body Aches   iPad interpreter services used. Interpreter # [phone] Marijean Niemann).  HPI Amber Torres is a 30 y.o. at [redacted]w[redacted]d who presents to MAU with fever, body aches, and cough. Her symptoms started yesterday. Her partner is also sick at home and they think they got sick from his co-worker. She is eating and drinking without issue. She has had some nausea but denies vomiting or diarrhea. She denies any urinary symptoms including dysuria or abdominal pain. She has taken a small dose of DayQuil before coming. She has no other concerns at this time.  Vaginal bleeding: No LOF: No Fetal Movement: Yes; a little less than usual Contractions: No  --/--/A POS (05/14 1707)  OB History     Gravida  1   Para      Term      Preterm      AB      Living         SAB      IAB      Ectopic      Multiple      Live Births              Past Medical History:  Diagnosis Date   Medical history non-contributory     Past Surgical History:  Procedure Laterality Date   NO PAST SURGERIES      History reviewed. No pertinent family history.  Social History   Socioeconomic History   Marital status: Married    Spouse name: Not on file   Number of children: Not on file   Years of education: Not on file   Highest education level: Not on file  Occupational History   Not on file  Tobacco Use   Smoking status: Never   Smokeless tobacco: Never  Vaping Use   Vaping Use: Never used  Substance and Sexual Activity   Alcohol use: Never   Drug use: Never   Sexual activity: Yes    Birth control/protection: None  Other Topics Concern   Not on file  Social History Narrative   Not on file   Social Determinants of Health   Financial Resource Strain: Not on file  Food Insecurity: Not on file  Transportation Needs: Not on file  Physical Activity: Not  on file  Stress: Not on file  Social Connections: Not on file  Intimate Partner Violence: Not on file    No Known Allergies  No current facility-administered medications on file prior to encounter.   Current Outpatient Medications on File Prior to Encounter  Medication Sig Dispense Refill   Prenatal Vit-Fe Fumarate-FA (MULTIVITAMIN-PRENATAL) 27-0.8 MG TABS tablet Take 1 tablet by mouth daily at 12 noon.     benzonatate (TESSALON PERLES) 100 MG capsule Take 2 capsules (200 mg total) by mouth 3 (three) times daily as needed for cough. 30 capsule 0   docusate sodium (COLACE) 100 MG capsule Take 1 capsule (100 mg total) by mouth every 12 (twelve) hours. 60 capsule 0   guaiFENesin (ROBITUSSIN) 100 MG/5ML SOLN Take 5 mLs (100 mg total) by mouth every 4 (four) hours as needed for cough or to loosen phlegm. 236 mL 0   metroNIDAZOLE (FLAGYL) 500 MG tablet Take 1 tablet (500 mg total) by mouth 2 (two) times daily. 14 tablet 0    ROS Pertinent positives and negative per  HPI, all others reviewed and negative  Physical Exam   BP (!) 102/58   Pulse (!) 120   Temp 98 F (36.7 C) (Oral)   Resp 18   LMP 02/03/2021   Physical Exam Constitutional:      General: She is not in acute distress. HENT:     Head: Normocephalic and atraumatic.     Nose: Congestion present.     Mouth/Throat:     Mouth: Mucous membranes are moist.  Eyes:     Conjunctiva/sclera: Conjunctivae normal.     Comments: Mild tearing present  Cardiovascular:     Rate and Rhythm: Regular rhythm. Tachycardia present.  Pulmonary:     Effort: Pulmonary effort is normal.     Breath sounds: Normal breath sounds. No wheezing or rhonchi.  Abdominal:     General: Bowel sounds are normal.     Palpations: Abdomen is soft.     Tenderness: There is no abdominal tenderness.  Musculoskeletal:        General: Normal range of motion.     Right lower leg: No edema.     Left lower leg: No edema.  Skin:    General: Skin is warm and  dry.  Neurological:     General: No focal deficit present.     Mental Status: She is alert.  Psychiatric:        Mood and Affect: Mood normal.        Behavior: Behavior normal.   FHT Baseline 150 bpm, moderate variability, + accels, no decels Toco: No contractions  Cat: 1  Labs Results for orders placed or performed during the hospital encounter of 09/27/21 (from the past 24 hour(s))  Urinalysis, Routine w reflex microscopic Urine, Clean Catch     Status: Abnormal   Collection Time: 09/27/21  8:11 PM  Result Value Ref Range   Color, Urine COLORLESS (A) YELLOW   APPearance CLEAR CLEAR   Specific Gravity, Urine 1.001 (L) 1.005 - 1.030   pH 7.0 5.0 - 8.0   Glucose, UA NEGATIVE NEGATIVE mg/dL   Hgb urine dipstick NEGATIVE NEGATIVE   Bilirubin Urine NEGATIVE NEGATIVE   Ketones, ur NEGATIVE NEGATIVE mg/dL   Protein, ur NEGATIVE NEGATIVE mg/dL   Nitrite NEGATIVE NEGATIVE   Leukocytes,Ua NEGATIVE NEGATIVE  Resp Panel by RT-PCR (Flu A&B, Covid) Nasopharyngeal Swab     Status: Abnormal   Collection Time: 09/27/21  8:12 PM   Specimen: Nasopharyngeal Swab; Nasopharyngeal(NP) swabs in vial transport medium  Result Value Ref Range   SARS Coronavirus 2 by RT PCR NEGATIVE NEGATIVE   Influenza A by PCR POSITIVE (A) NEGATIVE   Influenza B by PCR NEGATIVE NEGATIVE    Imaging No results found.  MAU Course  Procedures Lab Orders         Resp Panel by RT-PCR (Flu A&B, Covid) Nasopharyngeal Swab         Urinalysis, Routine w reflex microscopic Urine, Clean Catch    No orders of the defined types were placed in this encounter.  Imaging Orders  No imaging studies ordered today    MDM Patient presents with 1 day of flu-like symptoms HDS on arrival, afebrile, no acute distress Cardiopulmonary exam normal Congestion and tearing present No abdominal pain No urinary symptoms; UA negative  Overall presentation consistent with viral URI  Influenza A positive Normal FHT/Cat 1 and  reactive  Assessment and Plan   1. Viral URI with cough - Stable for discharge home with strict return precautions -  Counseled on supportive care measures to use at home to help with symptoms - Cat 1 and reactive NST - Advised to return if symptoms worsen or if patient feels that the fetal movement decreases again and is persistent - Counseled about kick counts to monitor - Patient will follow up with her OB/GYN next week - Patient voiced understanding of all information. Interpreter services used as noted above.  Worthy Rancher, MD

## 2021-09-27 NOTE — Discharge Instructions (Signed)
You may take Robitussin for cough and Tylenol 1000 mg every 8 hours as needed for fever or body aches.  Make sure to stay hydrated and drink plenty of fluids. Return to MAU if your symptoms worsen or you have any other emergent obstetric complaints.

## 2021-10-13 DIAGNOSIS — Z3400 Encounter for supervision of normal first pregnancy, unspecified trimester: Secondary | ICD-10-CM | POA: Insufficient documentation

## 2021-10-13 HISTORY — DX: Encounter for supervision of normal first pregnancy, unspecified trimester: Z34.00

## 2021-10-15 ENCOUNTER — Other Ambulatory Visit: Payer: Self-pay

## 2021-10-15 ENCOUNTER — Ambulatory Visit (INDEPENDENT_AMBULATORY_CARE_PROVIDER_SITE_OTHER): Payer: Self-pay

## 2021-10-15 VITALS — BP 111/69 | HR 76 | Temp 97.3°F | Ht 62.0 in | Wt 144.4 lb

## 2021-10-15 DIAGNOSIS — Z3A34 34 weeks gestation of pregnancy: Secondary | ICD-10-CM

## 2021-10-15 DIAGNOSIS — Z3400 Encounter for supervision of normal first pregnancy, unspecified trimester: Secondary | ICD-10-CM

## 2021-10-15 DIAGNOSIS — Z789 Other specified health status: Secondary | ICD-10-CM

## 2021-10-15 NOTE — Progress Notes (Signed)
Subjective:   Amber Torres is a 30 y.o. G1P0 at [redacted]w[redacted]d by early ultrasound being seen today for her first obstetrical visit. Patient is transferring care from [facility]. Her obstetrical history is significant for  none  and has Supervision of normal first pregnancy, antepartum on their problem list.. Patient does intend to breast feed. Pregnancy history fully reviewed.  Patient reports no complaints.  HISTORY: OB History  Gravida Para Term Preterm AB Living  1 0 0 0 0 0  SAB IAB Ectopic Multiple Live Births  0 0 0 0 0    # Outcome Date GA Lbr Len/2nd Weight Sex Delivery Anes PTL Lv  1 Current            Past Medical History:  Diagnosis Date   Medical history non-contributory    Past Surgical History:  Procedure Laterality Date   NO PAST SURGERIES     History reviewed. No pertinent family history. Social History   Tobacco Use   Smoking status: Never    Passive exposure: Never   Smokeless tobacco: Never  Vaping Use   Vaping Use: Never used  Substance Use Topics   Alcohol use: Never   Drug use: Never   No Known Allergies Current Outpatient Medications on File Prior to Visit  Medication Sig Dispense Refill   Prenatal Vit-Fe Fumarate-FA (MULTIVITAMIN-PRENATAL) 27-0.8 MG TABS tablet Take 1 tablet by mouth daily at 12 noon.     No current facility-administered medications on file prior to visit.    Exam   Vitals:   10/13/21 1017 10/15/21 1318  BP:  111/69  Pulse:  76  Temp:  (!) 97.3 F (36.3 C)  Weight:  144 lb 6.4 oz (65.5 kg)  Height: 5\' 2"  (1.575 m)    Fetal Heart Rate (bpm): 155  Uterus:  Fundal Height: 34 cm  Pelvic Exam: Perineum: deferred   Vulva: deferred   Vagina:  deferred   Cervix: deferred   Adnexa: deferred   Bony Pelvis: deferred  System: General: well-developed, well-nourished female in no acute distress   Breast:  normal appearance, no masses or tenderness   Skin: normal coloration and turgor, no rashes   Neurologic: oriented, normal,  negative, normal mood   Extremities: normal strength, tone, and muscle mass, ROM of all joints is normal   HEENT PERRLA, extraocular movement intact and sclera clear, anicteric   Mouth/Teeth mucous membranes moist, pharynx normal without lesions and dental hygiene good   Neck supple and no masses   Cardiovascular: regular rate and rhythm   Respiratory:  no respiratory distress, normal breath sounds   Abdomen: soft, non-tender; bowel sounds normal; no masses,  no organomegaly     Assessment:   Pregnancy: G1P0 Patient Active Problem List   Diagnosis Date Noted   Supervision of normal first pregnancy, antepartum 10/13/2021     Plan:  1. Supervision of normal first pregnancy, antepartum - Routine NOB. Patient doing well. No concerns - Endorses active fetal movement - Transfer from [facility]. Reviewed all records. Pap smear done at [facility] in 2021. Release of information signed to have pap results sent over - Patient is [employer]. Letter provided for Tdap vaccine  2. [redacted] weeks gestation of pregnancy   3. Language barrier affecting health care - Spanish interpretor used during entire visit   Continue prenatal vitamins. Problem list reviewed and updated. The nature of [facility] with multiple MDs and other Advanced Practice Providers was explained to  patient; also emphasized that residents, students are part of our team. Routine obstetric precautions reviewed. Return in about 2 weeks (around 10/29/2021).    Brand Males, CNM 10/15/21 1:55 PM

## 2021-10-30 ENCOUNTER — Other Ambulatory Visit: Payer: Self-pay

## 2021-10-30 ENCOUNTER — Ambulatory Visit (INDEPENDENT_AMBULATORY_CARE_PROVIDER_SITE_OTHER): Payer: Self-pay | Admitting: Obstetrics and Gynecology

## 2021-10-30 ENCOUNTER — Other Ambulatory Visit (HOSPITAL_COMMUNITY)
Admission: RE | Admit: 2021-10-30 | Discharge: 2021-10-30 | Disposition: A | Discharge status: Home / Self Care | Payer: Self-pay | Source: Ambulatory Visit | Attending: Obstetrics and Gynecology | Admitting: Obstetrics and Gynecology

## 2021-10-30 VITALS — BP 100/65 | HR 106 | Temp 97.5°F | Wt 141.6 lb

## 2021-10-30 DIAGNOSIS — O0933 Supervision of pregnancy with insufficient antenatal care, third trimester: Secondary | ICD-10-CM

## 2021-10-30 DIAGNOSIS — Z3400 Encounter for supervision of normal first pregnancy, unspecified trimester: Secondary | ICD-10-CM | POA: Insufficient documentation

## 2021-10-30 DIAGNOSIS — Z789 Other specified health status: Secondary | ICD-10-CM

## 2021-10-30 DIAGNOSIS — Z3A36 36 weeks gestation of pregnancy: Secondary | ICD-10-CM

## 2021-10-30 NOTE — Progress Notes (Signed)
° °  LOW-RISK PREGNANCY OFFICE VISIT Patient name: Amber Torres MRN [id number]  Date of birth: 09/19/1991 Chief Complaint:   Routine Prenatal Visit  History of Present Illness:   Amber Torres is a 30 y.o. G1P0 female at [redacted]w[redacted]d with an Estimated Date of Delivery: 11/23/21 being seen today for ongoing management of a low-risk pregnancy.  Today she reports no complaints. Contractions: Not present. Vag. Bleeding: None.  Movement: Present. denies leaking of fluid. Review of Systems:   Pertinent items are noted in HPI Denies abnormal vaginal discharge w/ itching/odor/irritation, headaches, visual changes, shortness of breath, chest pain, abdominal pain, severe nausea/vomiting, or problems with urination or bowel movements unless otherwise stated above. Pertinent History Reviewed:  Reviewed past medical,surgical, social, obstetrical and family history.  Reviewed problem list, medications and allergies. Physical Assessment:   Vitals:   10/30/21 0858  BP: 100/65  Pulse: (!) 106  Temp: (!) 97.5 F (36.4 C)  Weight: 141 lb 9.6 oz (64.2 kg)  Body mass index is 25.9 kg/m.        Physical Examination:   General appearance: Well appearing, and in no distress  Mental status: Alert, oriented to person, place, and time  Skin: Warm & dry  Cardiovascular: Normal heart rate noted  Respiratory: Normal respiratory effort, no distress  Abdomen: Soft, gravid, nontender  Pelvic: Cervical exam performed  Dilation: Closed Effacement (%): Thick Station: Ballotable  Extremities: Edema: None  Fetal Status: Fetal Heart Rate (bpm): 130 Fundal Height: 40 cm Movement: Present Presentation: Vertex  No results found for this or any previous visit (from the past 24 hour(s)).  Assessment & Plan:  1) Low-risk pregnancy G1P0 at [redacted]w[redacted]d with an Estimated Date of Delivery: 11/23/21   2) Supervision of normal first pregnancy, antepartum  - Culture, beta strep (group b only),  - Cervicovaginal ancillary only( [facility])  3) [redacted] weeks gestation of pregnancy   4) Language barrier affecting health care - [employer] Video Spanish Interpreter, [place] [phone] used for entire visit  5) Limited prenatal care in third trimester    Meds: No orders of the defined types were placed in this encounter.  Labs/procedures today: GBS, GC/CT and cervical exam  Plan:  Continue routine obstetrical care   Reviewed: Preterm labor symptoms and general obstetric precautions including but not limited to vaginal bleeding, contractions, leaking of fluid and fetal movement were reviewed in detail with the patient.  All questions were answered.   Follow-up: Return in about 1 week (around 11/06/2021) for Return OB visit.  Orders Placed This Encounter  Procedures   Culture, beta strep (group b only)   [name] Mora MSN, CNM 10/30/2021 12:15 PM

## 2021-10-31 LAB — CERVICOVAGINAL ANCILLARY ONLY
Chlamydia: NEGATIVE
Comment: NEGATIVE
Comment: NEGATIVE
Comment: NORMAL
Neisseria Gonorrhea: NEGATIVE
Trichomonas: NEGATIVE

## 2021-11-02 NOTE — L&D Delivery Note (Signed)
Delivery Note After a 15 minute 2nd stage, at 12:14 AM a viable female was delivered via Vaginal, Spontaneous (Presentation: Right Occiput Anterior).  APGAR: 9, 9; weight  pending.  After 1 minute, the cord was clamped and cut. 40 units of pitocin diluted in 1000cc LR was infused rapidly IV.  The placenta separated spontaneously and delivered via CCT and maternal pushing effort.  It was inspected and appears to be intact with a 3 VC.   Anesthesia: Epidural Episiotomy: None Lacerations: 1st degree;Vaginal Suture Repair: 2.0 chromic Est. Blood Loss (mL): 500  Mom to postpartum.  Baby to Couplet care / Skin to Skin.  Amber Torres 11/21/2021, 12:41 AM

## 2021-11-03 LAB — CULTURE, BETA STREP (GROUP B ONLY): Strep Gp B Culture: NEGATIVE

## 2021-11-06 ENCOUNTER — Encounter: Payer: Self-pay | Admitting: Obstetrics and Gynecology

## 2021-11-13 ENCOUNTER — Encounter: Payer: Self-pay | Admitting: Obstetrics and Gynecology

## 2021-11-13 ENCOUNTER — Ambulatory Visit (INDEPENDENT_AMBULATORY_CARE_PROVIDER_SITE_OTHER): Payer: Self-pay | Admitting: Obstetrics and Gynecology

## 2021-11-13 ENCOUNTER — Other Ambulatory Visit: Payer: Self-pay

## 2021-11-13 VITALS — BP 124/70 | HR 95 | Temp 97.4°F | Wt 151.4 lb

## 2021-11-13 DIAGNOSIS — Z3A38 38 weeks gestation of pregnancy: Secondary | ICD-10-CM

## 2021-11-13 DIAGNOSIS — Z3400 Encounter for supervision of normal first pregnancy, unspecified trimester: Secondary | ICD-10-CM

## 2021-11-13 DIAGNOSIS — Z789 Other specified health status: Secondary | ICD-10-CM

## 2021-11-13 NOTE — Progress Notes (Signed)
° °  LOW-RISK PREGNANCY OFFICE VISIT Patient name: Amber Torres MRN [id number]  Date of birth: 06/16/1991 Chief Complaint:   Routine Prenatal Visit  History of Present Illness:   Amber Torres is a 31 y.o. G1P0 female at [redacted]w[redacted]d with an Estimated Date of Delivery: 11/23/21 being seen today for ongoing management of a low-risk pregnancy.  Today she reports no complaints. Contractions: Not present. Vag. Bleeding: None.  Movement: Present. denies leaking of fluid. Review of Systems:   Pertinent items are noted in HPI Denies abnormal vaginal discharge w/ itching/odor/irritation, headaches, visual changes, shortness of breath, chest pain, abdominal pain, severe nausea/vomiting, or problems with urination or bowel movements unless otherwise stated above. Pertinent History Reviewed:  Reviewed past medical,surgical, social, obstetrical and family history.  Reviewed problem list, medications and allergies. Physical Assessment:   Vitals:   11/13/21 1020  BP: 124/70  Pulse: 95  Temp: (!) 97.4 F (36.3 C)  Weight: 151 lb 6.4 oz (68.7 kg)  Body mass index is 27.69 kg/m.        Physical Examination:   General appearance: Well appearing, and in no distress  Mental status: Alert, oriented to person, place, and time  Skin: Warm & dry  Cardiovascular: Normal heart rate noted  Respiratory: Normal respiratory effort, no distress  Abdomen: Soft, gravid, nontender  Pelvic: Cervical exam deferred         Extremities: Edema: None  Fetal Status: Fetal Heart Rate (bpm): 145 Fundal Height: 37 cm Movement: Present Presentation: Vertex  No results found for this or any previous visit (from the past 24 hour(s)).  Assessment & Plan:  1) Low-risk pregnancy G1P0 at [redacted]w[redacted]d with an Estimated Date of Delivery: 11/23/21   2) Supervision of normal first pregnancy, antepartum - Await labor  3) Language barrier affecting health care - [employer] Video Spanish Interpreter, [place] [phone] used for entire visit    4) [redacted] weeks gestation of pregnancy    Meds: No orders of the defined types were placed in this encounter.  Labs/procedures today: none  Plan:  Continue routine obstetrical care   Reviewed: Term labor symptoms and general obstetric precautions including but not limited to vaginal bleeding, contractions, leaking of fluid and fetal movement were reviewed in detail with the patient.  All questions were answered.   Follow-up: Return in about 1 week (around 11/20/2021) for Return OB visit.  No orders of the defined types were placed in this encounter.  [name] Mora MSN, CNM 11/13/2021 10:00 AM

## 2021-11-20 ENCOUNTER — Other Ambulatory Visit: Payer: Self-pay

## 2021-11-20 ENCOUNTER — Encounter: Payer: Self-pay | Admitting: Obstetrics and Gynecology

## 2021-11-20 ENCOUNTER — Inpatient Hospital Stay (HOSPITAL_COMMUNITY)
Admission: AD | Admit: 2021-11-20 | Discharge: 2021-11-22 | DRG: 807 | Disposition: A | Discharge status: Home / Self Care | Payer: Medicaid Other | Attending: Obstetrics & Gynecology | Admitting: Obstetrics & Gynecology

## 2021-11-20 ENCOUNTER — Inpatient Hospital Stay (HOSPITAL_COMMUNITY): Payer: Medicaid Other | Admitting: Anesthesiology

## 2021-11-20 ENCOUNTER — Encounter (HOSPITAL_COMMUNITY): Payer: Self-pay | Admitting: Obstetrics & Gynecology

## 2021-11-20 ENCOUNTER — Ambulatory Visit (INDEPENDENT_AMBULATORY_CARE_PROVIDER_SITE_OTHER): Payer: Self-pay | Admitting: Obstetrics and Gynecology

## 2021-11-20 VITALS — BP 109/71 | HR 95 | Temp 97.5°F | Wt 151.2 lb

## 2021-11-20 DIAGNOSIS — Z3400 Encounter for supervision of normal first pregnancy, unspecified trimester: Secondary | ICD-10-CM

## 2021-11-20 DIAGNOSIS — O26893 Other specified pregnancy related conditions, third trimester: Secondary | ICD-10-CM | POA: Diagnosis present

## 2021-11-20 DIAGNOSIS — Z20822 Contact with and (suspected) exposure to covid-19: Secondary | ICD-10-CM | POA: Diagnosis present

## 2021-11-20 DIAGNOSIS — Z789 Other specified health status: Secondary | ICD-10-CM

## 2021-11-20 DIAGNOSIS — Z3A39 39 weeks gestation of pregnancy: Secondary | ICD-10-CM | POA: Diagnosis not present

## 2021-11-20 LAB — TYPE AND SCREEN
ABO/RH(D): A POS
Antibody Screen: NEGATIVE

## 2021-11-20 LAB — CBC
HCT: 39.3 % (ref 36.0–46.0)
Hemoglobin: 13.6 g/dL (ref 12.0–15.0)
MCH: 32.9 pg (ref 26.0–34.0)
MCHC: 34.6 g/dL (ref 30.0–36.0)
MCV: 94.9 fL (ref 80.0–100.0)
Platelets: 198 10*3/uL (ref 150–400)
RBC: 4.14 MIL/uL (ref 3.87–5.11)
RDW: 13.1 % (ref 11.5–15.5)
WBC: 14.8 10*3/uL — ABNORMAL HIGH (ref 4.0–10.5)
nRBC: 0 % (ref 0.0–0.2)

## 2021-11-20 LAB — RESP PANEL BY RT-PCR (FLU A&B, COVID) ARPGX2
Influenza A by PCR: NEGATIVE
Influenza B by PCR: NEGATIVE
SARS Coronavirus 2 by RT PCR: NEGATIVE

## 2021-11-20 MED ORDER — LACTATED RINGERS IV SOLN: 500.0000 mL | INTRAVENOUS | Status: DC | PRN

## 2021-11-20 MED ORDER — ACETAMINOPHEN 325 MG PO TABS: 650.0000 mg | ORAL_TABLET | ORAL | Status: DC | PRN

## 2021-11-20 MED ORDER — SOD CITRATE-CITRIC ACID 500-334 MG/5ML PO SOLN: 30.0000 mL | ORAL | Status: DC | PRN

## 2021-11-20 MED ORDER — OXYCODONE-ACETAMINOPHEN 5-325 MG PO TABS: 1.0000 | ORAL_TABLET | ORAL | Status: DC | PRN

## 2021-11-20 MED ORDER — OXYTOCIN-SODIUM CHLORIDE 30-0.9 UT/500ML-% IV SOLN
2.5000 [IU]/h | INTRAVENOUS | Status: DC
  Administered 2021-11-21: 2.5 [IU]/h via INTRAVENOUS
  Filled 2021-11-20: qty 500

## 2021-11-20 MED ORDER — OXYCODONE-ACETAMINOPHEN 5-325 MG PO TABS: 2.0000 | ORAL_TABLET | ORAL | Status: DC | PRN

## 2021-11-20 MED ORDER — LIDOCAINE HCL (PF) 1 % IJ SOLN
INTRAMUSCULAR | Status: DC | PRN
  Administered 2021-11-20 (×2): 5 mL via EPIDURAL

## 2021-11-20 MED ORDER — ONDANSETRON HCL 4 MG/2ML IJ SOLN: 4.0000 mg | Freq: Four times a day (QID) | INTRAMUSCULAR | Status: DC | PRN

## 2021-11-20 MED ORDER — DIPHENHYDRAMINE HCL 50 MG/ML IJ SOLN: 12.5000 mg | INTRAMUSCULAR | Status: DC | PRN

## 2021-11-20 MED ORDER — PHENYLEPHRINE 40 MCG/ML (10ML) SYRINGE FOR IV PUSH (FOR BLOOD PRESSURE SUPPORT): 80.0000 ug | PREFILLED_SYRINGE | INTRAVENOUS | Status: DC | PRN

## 2021-11-20 MED ORDER — OXYTOCIN BOLUS FROM INFUSION
333.0000 mL | Freq: Once | INTRAVENOUS | Status: AC
  Administered 2021-11-21: 333 mL via INTRAVENOUS

## 2021-11-20 MED ORDER — LACTATED RINGERS IV SOLN
500.0000 mL | Freq: Once | INTRAVENOUS | Status: AC
  Administered 2021-11-20: 500 mL via INTRAVENOUS

## 2021-11-20 MED ORDER — LACTATED RINGERS IV SOLN: INTRAVENOUS | Status: DC

## 2021-11-20 MED ORDER — EPHEDRINE 5 MG/ML INJ: 10.0000 mg | INTRAVENOUS | Status: DC | PRN

## 2021-11-20 MED ORDER — LIDOCAINE HCL (PF) 1 % IJ SOLN: 30.0000 mL | INTRAMUSCULAR | Status: DC | PRN

## 2021-11-20 MED ORDER — FENTANYL-BUPIVACAINE-NACL 0.5-0.125-0.9 MG/250ML-% EP SOLN
12.0000 mL/h | EPIDURAL | Status: DC | PRN
  Administered 2021-11-20: 12 mL/h via EPIDURAL

## 2021-11-20 MED ORDER — OXYTOCIN-SODIUM CHLORIDE 30-0.9 UT/500ML-% IV SOLN: 2.5000 [IU]/h | INTRAVENOUS | Status: DC

## 2021-11-20 MED ORDER — FENTANYL CITRATE (PF) 100 MCG/2ML IJ SOLN: 50.0000 ug | INTRAMUSCULAR | Status: DC | PRN

## 2021-11-20 MED ORDER — FENTANYL-BUPIVACAINE-NACL 0.5-0.125-0.9 MG/250ML-% EP SOLN
EPIDURAL | Status: AC
  Filled 2021-11-20: qty 250

## 2021-11-20 MED ORDER — FLEET ENEMA 7-19 GM/118ML RE ENEM: 1.0000 | ENEMA | RECTAL | Status: DC | PRN

## 2021-11-20 MED ORDER — OXYTOCIN BOLUS FROM INFUSION: 333.0000 mL | Freq: Once | INTRAVENOUS | Status: DC

## 2021-11-20 NOTE — Progress Notes (Addendum)
° °  LOW-RISK PREGNANCY OFFICE VISIT Patient name: Amber Torres MRN [id number]  Date of birth: 07/05/1991 Chief Complaint:   Routine Prenatal Visit  History of Present Illness:   Amber Torres is a 31 y.o. G1P0 female at [redacted]w[redacted]d with an Estimated Date of Delivery: 11/23/21 being seen today for ongoing management of a low-risk pregnancy.  Today she reports occasional contractions and brownish vaginal discharge this morning . Contractions: Irritability. Vag. Bleeding: None.  Movement: Present. denies leaking of fluid. Review of Systems:   Pertinent items are noted in HPI Denies abnormal vaginal discharge w/ itching/odor/irritation, headaches, visual changes, shortness of breath, chest pain, abdominal pain, severe nausea/vomiting, or problems with urination or bowel movements unless otherwise stated above. Pertinent History Reviewed:  Reviewed past medical,surgical, social, obstetrical and family history.  Reviewed problem list, medications and allergies. Physical Assessment:   Vitals:   11/20/21 1353  BP: 109/71  Pulse: 95  Temp: (!) 97.5 F (36.4 C)  Weight: 151 lb 3.2 oz (68.6 kg)  Body mass index is 27.65 kg/m.        Physical Examination:   General appearance: Well appearing, and in no distress  Mental status: Alert, oriented to person, place, and time  Skin: Warm & dry  Cardiovascular: Normal heart rate noted  Respiratory: Normal respiratory effort, no distress  Abdomen: Soft, gravid, nontender  Pelvic: Cervical exam performed  Dilation: 3 Effacement (%): 80 Station: -1  Extremities: Edema: None  Fetal Status: Fetal Heart Rate (bpm): 136 Fundal Height: 37 cm Movement: Present Presentation: Vertex  No results found for this or any previous visit (from the past 24 hour(s)).  Assessment & Plan:  1) Low-risk pregnancy G1P0 at [redacted]w[redacted]d with an Estimated Date of Delivery: 11/23/21   2) Supervision of normal first pregnancy, antepartum - 5-1-1 Rule Go to MAU for painful contractions  every 5 minutes, lasting 1 minute each for 1 hour. - Reassurance given that she is having bloody show which is normal  3) Language barrier affecting health care - [employer] Video Spanish Twanna Hy [phone] used for entire visit   4) [redacted] weeks gestation of pregnancy    Meds: No orders of the defined types were placed in this encounter.  Labs/procedures today: cervical check  Plan:  Continue routine obstetrical care   Reviewed: Term labor symptoms and general obstetric precautions including but not limited to vaginal bleeding, contractions, leaking of fluid and fetal movement were reviewed in detail with the patient.  All questions were answered.   Follow-up: Return in about 1 week (around 11/27/2021) for Return OB visit.  No orders of the defined types were placed in this encounter.  [name] Mora MSN, CNM 11/20/2021 2:03 PM

## 2021-11-20 NOTE — Discharge Summary (Signed)
° °  Postpartum Discharge Summary ° °Patient Name: Amber Torres °DOB: 11/23/1990 °MRN: [id number] ° °Date of admission: 11/20/2021 °Delivery date:11/21/2021  °Delivering provider: CRESENZO-DISHMON, FRANCES  °Date of discharge: 11/22/2021 ° °Admitting diagnosis: Normal labor [O80, Z37.9] °Indication for care in labor or delivery [O75.9] °Intrauterine pregnancy: [redacted]w[redacted]d     °Secondary diagnosis:  Principal Problem: °  Vaginal delivery °Active Problems: °  Supervision of normal first pregnancy, antepartum °  Normal labor ° °Additional problems: None     °Discharge diagnosis: Term Pregnancy Delivered                                              °Post partum procedures: None °Augmentation: AROM °Complications: None ° °Hospital course: Onset of Labor With Vaginal Delivery      °31 y.o. yo G1P0 at [redacted]w[redacted]d was admitted in Active Labor on 11/20/2021. Patient had an uncomplicated labor course as follows:  °Membrane Rupture Time/Date: 10:43 PM ,11/20/2021   °Delivery Method:Vaginal, Spontaneous  °Episiotomy: None  °Lacerations:  1st degree;Vaginal  °Patient had an uncomplicated postpartum course.  She is ambulating, tolerating a regular diet, passing flatus, and urinating well. Patient is discharged home in stable condition on 11/22/21. ° °Newborn Data: °Birth date:11/21/2021  °Birth time:12:14 AM  °Gender:Female  °Living status:Living  °Apgars:9 ,9  °Weight:3005 g  ° °Magnesium Sulfate received: No °BMZ received: No °Rhophylac: N/A °MMR: N/A °T-DaP: Given prenatally °Flu: Yes °Transfusion: No ° °Physical exam  °Vitals:  ° 11/21/21 1023 11/21/21 1507 11/21/21 2042 11/22/21 0519  °BP: 104/66 111/68 113/73 115/77  °Pulse: 95 80 92 82  °Resp: 18 17 18 18  °Temp: 99.3 °F (37.4 °C) 98.2 °F (36.8 °C) 98.1 °F (36.7 °C) 98 °F (36.7 °C)  °TempSrc: Oral Oral Oral Oral  °SpO2: 98% 98% 98% 100%  °Weight:      °Height:      ° °General: alert, cooperative, and no distress °Lochia: appropriate °Uterine Fundus: firm and below umbilicus  °DVT Evaluation: no  LE edema or calf tenderness to palpation ° °Labs: °Lab Results  °Component Value Date  ° WBC 14.8 (H) 11/20/2021  ° HGB 13.6 11/20/2021  ° HCT 39.3 11/20/2021  ° MCV 94.9 11/20/2021  ° PLT 198 11/20/2021  ° °CMP Latest Ref Rng & Units 07/14/2021  °Glucose 70 - 99 mg/dL 107(H)  °BUN 6 - 20 mg/dL 7  °Creatinine 0.44 - 1.00 mg/dL 0.52  °Sodium 135 - 145 mmol/L 136  °Potassium 3.5 - 5.1 mmol/L 3.9  °Chloride 98 - 111 mmol/L 102  °CO2 22 - 32 mmol/L 24  °Calcium 8.9 - 10.3 mg/dL 8.8(L)  °Total Protein 6.5 - 8.1 g/dL 6.6  °Total Bilirubin 0.3 - 1.2 mg/dL 0.2(L)  °Alkaline Phos 38 - 126 U/L 48  °AST 15 - 41 U/L 25  °ALT 0 - 44 U/L 20  ° °Edinburgh Score: °Edinburgh Postnatal Depression Scale Screening Tool 11/21/2021  °I have been able to laugh and see the funny side of things. 3  °I have looked forward with enjoyment to things. 2  °I have blamed myself unnecessarily when things went wrong. 1  °I have been anxious or worried for no good reason. 2  °I have felt scared or panicky for no good reason. 1  °Things have been getting on top of me. 1  °I have been so unhappy that I have had difficulty sleeping.   1  °I have felt sad or miserable. 0  °I have been so unhappy that I have been crying. 1  °The thought of harming myself has occurred to me. 0  °Edinburgh Postnatal Depression Scale Total 12  ° ° ° °After visit meds:  °Allergies as of 11/22/2021   °No Known Allergies °  ° °  °Medication List  °  ° °TAKE these medications   ° °acetaminophen 325 MG tablet °Commonly known as: Tylenol °Take 3 tablets (975 mg total) by mouth every 8 (eight) hours as needed (pain). °  °ibuprofen 600 MG tablet °Commonly known as: ADVIL °Take 1 tablet (600 mg total) by mouth every 6 (six) hours as needed (pain). °  °multivitamin-prenatal 27-0.8 MG Tabs tablet °Take 1 tablet by mouth daily at 12 noon. °  °norethindrone 0.35 MG tablet °Commonly known as: MICRONOR °Take 1 tablet (0.35 mg total) by mouth daily. For birth control. °  ° °  ° ° °Discharge home  in stable condition °Infant Feeding: Bottle and Breast °Infant Disposition: home with mother °Discharge instruction: per After Visit Summary and Postpartum booklet. °Activity: Advance as tolerated. Pelvic rest for 6 weeks.  °Diet: routine diet °Future Appointments: °Future Appointments  °Date Time Provider Department Center  °12/18/2021 10:35 AM Dawson, Rolitta, CNM [facility] None  °01/05/2022 11:30 AM Mulberry, Elizabeth, MD [facility] None  ° °Follow up Visit: °Please schedule this patient for a In person postpartum visit in 4 weeks with the following provider: Any provider. °Additional Postpartum F/U: BH visit in 1 week for mood check. °Low risk pregnancy complicated by: None  °Delivery mode:  Vaginal, Spontaneous  °Anticipated Birth Control:  POPs ° °11/22/2021 ° M , MD ° ° ° °

## 2021-11-20 NOTE — H&P (Signed)
Amber Torres is a 31 y.o. female G1P0 with IUP at [redacted]w[redacted]d by early [place] presenting for contractions that have gotten stronger and closer together over the past several hours.  She reports positive fetal movement. She denies leakage of fluid or vaginal bleeding.  Prenatal History/Complications: PNC at [facility], transfer from [facility] Pregnancy complications:  - Past Medical History: Past Medical History:  Diagnosis Date   Medical history non-contributory     Past Surgical History: Past Surgical History:  Procedure Laterality Date   COLONOSCOPY  2021   NO PAST SURGERIES      Obstetrical History: OB History     Gravida  1   Para      Term      Preterm      AB      Living         SAB      IAB      Ectopic      Multiple      Live Births               Social History: Social History   Socioeconomic History   Marital status: Married    Spouse name: Not on file   Number of children: Not on file   Years of education: Not on file   Highest education level: High school graduate  Occupational History   Not on file  Tobacco Use   Smoking status: Never    Passive exposure: Never   Smokeless tobacco: Never  Vaping Use   Vaping Use: Never used  Substance and Sexual Activity   Alcohol use: Never   Drug use: Never   Sexual activity: Yes    Birth control/protection: None  Other Topics Concern   Not on file  Social History Narrative   Not on file   Social Determinants of Health   Financial Resource Strain: Not on file  Food Insecurity: Not on file  Transportation Needs: Not on file  Physical Activity: Not on file  Stress: Not on file  Social Connections: Not on file    Family History: No family history on file.  Allergies: No Known Allergies  Medications Prior to Admission  Medication Sig Dispense Refill Last Dose   Prenatal Vit-Fe Fumarate-FA (MULTIVITAMIN-PRENATAL) 27-0.8 MG TABS tablet Take 1 tablet by mouth daily at 12 noon.   11/20/2021     Review of Systems   Constitutional: Negative for fever and chills Eyes: Negative for visual disturbances Respiratory: Negative for shortness of breath, dyspnea Cardiovascular: Negative for chest pain or palpitations  Gastrointestinal: Negative for vomiting, diarrhea and constipation.  POSITIVE for abdominal pain (contractions) Genitourinary: Negative for dysuria and urgency Musculoskeletal: Negative for back pain, joint pain, myalgias  Neurological: Negative for dizziness and headaches  Blood pressure 113/69, pulse 93, temperature 98 F (36.7 C), temperature source Oral, resp. rate 16, height 5\' 2"  (1.575 m), weight 68.6 kg, last menstrual period 02/03/2021, SpO2 97 %. General appearance: alert, cooperative, and no distress Lungs: normal respiratory effort Heart: regular rate and rhythm Abdomen: soft, non-tender; bowel sounds normal Extremities: Homans sign is negative, no sign of DVT DTR's 2+ Presentation: cephalic Fetal monitoring  Baseline: 140 bpm, Variability: Good {> 6 bpm), Accelerations: Reactive, and Decelerations: Absent Uterine activity  q 2-3 minutes Dilation: 5 Effacement (%): 80 Station: -1 Exam by:: [email or web address], RN   Prenatal labs: ABO, Rh: --/--/PENDING (01/19 2018) Antibody: PENDING (01/19 2018) Rubella: Immune (06/30 1035) RPR: Nonreactive (10/26 1035)  HBsAg: Negative (06/30 1035)  HIV: Non-reactive (10/26 1035)  GBS: Negative/-- (12/29 0909)   Nursing Staff Provider  Office Location  [facility] Dating  Early ultrasound  Language  Spanish Anatomy US  Low-Lying Anterior Placenta>>RESOLVED  Flu Vaccine  Letter given for [facility] 10/15/21 Genetic/Carrier Screen  NIPS:   LR Boy AFP:   Declined Horizon: Negative  TDaP Vaccine   Letter given for [facility] 10/15/21 Hgb A1C or  GTT Early 5.5 Third trimester 1 hour-pass  COVID Vaccine 2 doses   LAB RESULTS   Rhogam  N/A Blood Type A/Positive/-- (06/30 1035)   Baby Feeding Plan Breast Antibody  Negative (06/30 1035)  Contraception pills Rubella Immune (06/30 1035)  Circumcision No RPR Nonreactive (10/26 1035)   Pediatrician  Info given HBsAg Negative (06/30 1035)   Support Person FOB-Luis HCVAb Negative  Prenatal Classes No HIV Non-reactive (10/26 1035)     BTL Consent N/A GBS  NEGATIVE (For PCN allergy, check sensitivities)   VBAC Consent N/A Pap        DME Rx-In person [ ]  BP cuff [ ]  Weight Scale Waterbirth  [ ]  Class [ ]  Consent [ ]  CNM visit  PHQ9 & GAD7 [ X ] new OB [  X] 28 weeks  [x  ] 36 weeks Induction  [ ]  Orders Entered [ ] Foley Y/N   Prenatal Transfer Tool  Maternal Diabetes: No Genetic Screening: Normal Maternal Ultrasounds/Referrals: Normal Fetal Ultrasounds or other Referrals:  None Maternal Substance Abuse:  No Significant Maternal Medications:  None Significant Maternal Lab Results: Group B Strep negative  Results for orders placed or performed during the hospital encounter of 11/20/21 (from the past 24 hour(s))  CBC   Collection Time: 11/20/21  8:18 PM  Result Value Ref Range   WBC 14.8 (H) 4.0 - 10.5 K/uL   RBC 4.14 3.87 - 5.11 MIL/uL   Hemoglobin 13.6 12.0 - 15.0 g/dL   HCT  - %   MCV 94.9 80.0 - 100.0 fL   MCH 32.9 26.0 - 34.0 pg   MCHC 34.6 30.0 - 36.0 g/dL   RDW  - 11/22/21 %   Platelets 198 150 - 400 K/uL   nRBC 0.0 0.0 - 0.2 %  Type and screen [facility]   Collection Time: 11/20/21  8:18 PM  Result Value Ref Range   ABO/RH(D) PENDING    Antibody Screen PENDING    Sample Expiration      11/23/2021,2359 Performed at [facility], [address], [place], [address] [facility]     Assessment: Amber Torres is a 31 y.o. G1P0 with an IUP at [redacted]w[redacted]d presenting for early labor  Plan: #Labor: expectant management #Pain:  Per request #FWB Cat 1 # [id number] 11/20/2021, 8:49 PM

## 2021-11-20 NOTE — Anesthesia Procedure Notes (Signed)
Epidural Patient location during procedure: OB Start time: 11/20/2021 9:25 PM End time: 11/20/2021 9:33 PM  Staffing Anesthesiologist: Mal Amabile, MD Performed: anesthesiologist   Preanesthetic Checklist Completed: patient identified, IV checked, site marked, risks and benefits discussed, surgical consent, monitors and equipment checked, pre-op evaluation and timeout performed  Epidural Patient position: sitting Prep: DuraPrep and site prepped and draped Patient monitoring: continuous pulse ox and blood pressure Approach: midline Location: L3-L4 Injection technique: LOR air  Needle:  Needle type: Tuohy  Needle gauge: 17 G Needle length: 9 cm and 9 Needle insertion depth: 4 cm Catheter type: closed end flexible Catheter size: 19 Gauge Catheter at skin depth: 9 cm Test dose: negative and Other  Assessment Events: blood not aspirated, injection not painful, no injection resistance, no paresthesia and negative IV test  Additional Notes Patient identified. Risks and benefits discussed including failed block, incomplete  Pain control, post dural puncture headache, nerve damage, paralysis, blood pressure Changes, nausea, vomiting, reactions to medications-both toxic and allergic and post Partum back pain. All questions were answered. Patient expressed understanding and wished to proceed. Sterile technique was used throughout procedure. Epidural site was Dressed with sterile barrier dressing. No paresthesias, signs of intravascular injection Or signs of intrathecal spread were encountered.  Patient was more comfortable after the epidural was dosed. Please see RN's note for documentation of vital signs and FHR which are stable. Reason for block:procedure for pain

## 2021-11-20 NOTE — MAU Note (Signed)
Amber Torres is a 31 y.o. at [redacted]w[redacted]d here in MAU reporting: contractions started this morning and they have gotten worse. They are every 4 minutes. Seeing some bloody mucus. Denies LOF. +FM  Onset of complaint: today  Pain score: 6/10  Vitals:   11/20/21 1647  BP: 118/74  Pulse: 91  Resp: 18  Temp: 98 F (36.7 C)  SpO2: 97%     FHT:153  Lab orders placed from triage: none

## 2021-11-20 NOTE — Anesthesia Preprocedure Evaluation (Signed)
Anesthesia Evaluation  Patient identified by MRN, date of birth, ID band Patient awake    Reviewed: Allergy & Precautions, Patient's Chart, lab work & pertinent test results  Airway Mallampati: II       Dental no notable dental hx.    Pulmonary neg pulmonary ROS,    Pulmonary exam normal        Cardiovascular negative cardio ROS Normal cardiovascular exam     Neuro/Psych negative neurological ROS  negative psych ROS   GI/Hepatic Neg liver ROS, GERD  ,  Endo/Other  negative endocrine ROS  Renal/GU negative Renal ROS  negative genitourinary   Musculoskeletal negative musculoskeletal ROS (+)   Abdominal   Peds  Hematology negative hematology ROS (+)   Anesthesia Other Findings   Reproductive/Obstetrics (+) Pregnancy                             Anesthesia Physical Anesthesia Plan  ASA: 2  Anesthesia Plan: Epidural   Post-op Pain Management:    Induction:   PONV Risk Score and Plan:   Airway Management Planned: Natural Airway  Additional Equipment:   Intra-op Plan:   Post-operative Plan:   Informed Consent: I have reviewed the patients History and Physical, chart, labs and discussed the procedure including the risks, benefits and alternatives for the proposed anesthesia with the patient or authorized representative who has indicated his/her understanding and acceptance.       Plan Discussed with: Anesthesiologist  Anesthesia Plan Comments:         Anesthesia Quick Evaluation

## 2021-11-20 NOTE — Progress Notes (Signed)
Patient Vitals for the past 4 hrs:  BP Temp Temp src Pulse SpO2  11/20/21 2231 (!) 98/44 -- -- (!) 102 --  11/20/21 2200 124/75 -- -- 87 96 %  11/20/21 2155 118/69 97.7 F (36.5 C) Oral 90 96 %  11/20/21 2150 119/68 -- -- 89 98 %  11/20/21 2135 129/76 -- -- 100 --  11/20/21 2134 -- -- -- -- 98 %  11/20/21 2131 139/64 -- -- 99 --  11/20/21 2128 131/68 -- -- 100 98 %   Comfortable w/epidural.  FHR Cat 1. Ctx q 2-3 minutes.  Cx small ant lip/100/0.  AROM w/clear fluid. Anticipate SVD soon.

## 2021-11-21 ENCOUNTER — Encounter (HOSPITAL_COMMUNITY): Payer: Self-pay | Admitting: Obstetrics & Gynecology

## 2021-11-21 DIAGNOSIS — Z3A39 39 weeks gestation of pregnancy: Secondary | ICD-10-CM

## 2021-11-21 LAB — RPR: RPR Ser Ql: NONREACTIVE

## 2021-11-21 MED ORDER — MEASLES, MUMPS & RUBELLA VAC IJ SOLR: 0.5000 mL | Freq: Once | INTRAMUSCULAR | Status: DC

## 2021-11-21 MED ORDER — ACETAMINOPHEN 325 MG PO TABS: 650.0000 mg | ORAL_TABLET | ORAL | Status: DC | PRN

## 2021-11-21 MED ORDER — DIPHENHYDRAMINE HCL 25 MG PO CAPS: 25.0000 mg | ORAL_CAPSULE | Freq: Four times a day (QID) | ORAL | Status: DC | PRN

## 2021-11-21 MED ORDER — ONDANSETRON HCL 4 MG PO TABS: 4.0000 mg | ORAL_TABLET | ORAL | Status: DC | PRN

## 2021-11-21 MED ORDER — BISACODYL 10 MG RE SUPP: 10.0000 mg | Freq: Every day | RECTAL | Status: DC | PRN

## 2021-11-21 MED ORDER — FLEET ENEMA 7-19 GM/118ML RE ENEM: 1.0000 | ENEMA | Freq: Every day | RECTAL | Status: DC | PRN

## 2021-11-21 MED ORDER — COCONUT OIL OIL: 1.0000 "application " | TOPICAL_OIL | Status: DC | PRN

## 2021-11-21 MED ORDER — METHYLERGONOVINE MALEATE 0.2 MG/ML IJ SOLN: 0.2000 mg | INTRAMUSCULAR | Status: DC | PRN

## 2021-11-21 MED ORDER — SIMETHICONE 80 MG PO CHEW: 80.0000 mg | CHEWABLE_TABLET | ORAL | Status: DC | PRN

## 2021-11-21 MED ORDER — DIBUCAINE (PERIANAL) 1 % EX OINT: 1.0000 "application " | TOPICAL_OINTMENT | CUTANEOUS | Status: DC | PRN

## 2021-11-21 MED ORDER — METHYLERGONOVINE MALEATE 0.2 MG PO TABS: 0.2000 mg | ORAL_TABLET | ORAL | Status: DC | PRN

## 2021-11-21 MED ORDER — IBUPROFEN 600 MG PO TABS
600.0000 mg | ORAL_TABLET | Freq: Four times a day (QID) | ORAL | Status: DC
  Administered 2021-11-21 – 2021-11-22 (×5): 600 mg via ORAL
  Filled 2021-11-21 (×5): qty 1

## 2021-11-21 MED ORDER — BENZOCAINE-MENTHOL 20-0.5 % EX AERO
1.0000 "application " | INHALATION_SPRAY | CUTANEOUS | Status: DC | PRN
  Administered 2021-11-21: 1 via TOPICAL
  Filled 2021-11-21: qty 56

## 2021-11-21 MED ORDER — MEDROXYPROGESTERONE ACETATE 150 MG/ML IM SUSP: 150.0000 mg | INTRAMUSCULAR | Status: DC | PRN

## 2021-11-21 MED ORDER — TETANUS-DIPHTH-ACELL PERTUSSIS 5-2.5-18.5 LF-MCG/0.5 IM SUSY: 0.5000 mL | PREFILLED_SYRINGE | Freq: Once | INTRAMUSCULAR | Status: DC

## 2021-11-21 MED ORDER — PRENATAL MULTIVITAMIN CH
1.0000 | ORAL_TABLET | Freq: Every day | ORAL | Status: DC
  Administered 2021-11-21: 1 via ORAL
  Filled 2021-11-21: qty 1

## 2021-11-21 MED ORDER — ONDANSETRON HCL 4 MG/2ML IJ SOLN: 4.0000 mg | INTRAMUSCULAR | Status: DC | PRN

## 2021-11-21 MED ORDER — WITCH HAZEL-GLYCERIN EX PADS: 1.0000 "application " | MEDICATED_PAD | CUTANEOUS | Status: DC | PRN

## 2021-11-21 MED ORDER — DOCUSATE SODIUM 100 MG PO CAPS
100.0000 mg | ORAL_CAPSULE | Freq: Two times a day (BID) | ORAL | Status: DC
  Administered 2021-11-21 – 2021-11-22 (×2): 100 mg via ORAL
  Filled 2021-11-21 (×2): qty 1

## 2021-11-21 MED ORDER — FERROUS SULFATE 325 (65 FE) MG PO TABS
325.0000 mg | ORAL_TABLET | ORAL | Status: DC
  Administered 2021-11-21: 325 mg via ORAL
  Filled 2021-11-21: qty 1

## 2021-11-21 NOTE — Anesthesia Postprocedure Evaluation (Signed)
Anesthesia Post Note  Patient: Amber Torres  Procedure(s) Performed: AN AD HOC LABOR EPIDURAL     Patient location during evaluation: Mother Baby Anesthesia Type: Epidural Level of consciousness: awake and alert Pain management: pain level controlled Vital Signs Assessment: post-procedure vital signs reviewed and stable Respiratory status: spontaneous breathing, nonlabored ventilation and respiratory function stable Cardiovascular status: stable Postop Assessment: no headache, no backache, epidural receding, no apparent nausea or vomiting, patient able to bend at knees, adequate PO intake and able to ambulate Anesthetic complications: no   No notable events documented.  Last Vitals:  Vitals:   11/21/21 0311 11/21/21 0637  BP: 121/70 110/71  Pulse: 84 79  Resp: 18 18  Temp: 36.8 C 36.6 C  SpO2: 97% 97%    Last Pain:  Vitals:   11/21/21 0637  TempSrc: Oral  PainSc: 0-No pain   Pain Goal:                   [employer]

## 2021-11-21 NOTE — Lactation Note (Signed)
This note was copied from a baby's chart. Lactation Consultation Note  Patient Name: Amber Torres YIFOY'D Date: 11/21/2021 Reason for consult: L&D Initial assessment Age:33 hours Dad used as interpreter this is mom's choice.  Mom latched infant on her right breast using the football hold position, infant latched with depth and was still breastfeeding after 12 minutes when LC left the room. Mom knows to BF infant according to primal cues, skin to skin. Mom knows to ask RN/LC  on MBU for further latch assistance if needed.  LC congratulated parents on infant's birth. Maternal Data    Feeding Mother's Current Feeding Choice: Breast Milk and Formula  LATCH Score Latch: Grasps breast easily, tongue down, lips flanged, rhythmical sucking.  Audible Swallowing: Spontaneous and intermittent  Type of Nipple: Everted at rest and after stimulation  Comfort (Breast/Nipple): Soft / non-tender  Hold (Positioning): Assistance needed to correctly position infant at breast and maintain latch.  LATCH Score: 9   Lactation Tools Discussed/Used    Interventions Interventions: Skin to skin;Breast compression;Adjust position;Support pillows;Position options;Education  Discharge    Consult Status Consult Status: Follow-up from L&D    Danelle Earthly 11/21/2021, 1:01 AM

## 2021-11-21 NOTE — Lactation Note (Signed)
This note was copied from a baby's chart. Lactation Consultation Note  Patient Name: Amber Torres HFWYO'V Date: 11/21/2021 Reason for consult: Follow-up assessment;Term;Primapara;1st time breastfeeding Age:33 hours   P1 mother whose infant is now 87 hours old.  This is a term baby at 39+5 weeks.  Mother's current feeding preference is breast/formula.    Mother is Spanish speaking; father to interpret for mother.    Family awake; arrived to assess feeding status and provide further education.  Mother had no questions/concerns related to breast feeding at this time.  She has recently breast fed "[name]."  He was asleep in her arms.  Encouraged STS and feeding on cue.  Reviewed cues and breast feeding basics.  Encouraged mother to call her RN/LC for assistance as needed.  Attempted to provide coconut oil, however, both Pyxis machines were out of stock.  Night shift RN will have day shift RN bring some when it is available.     Maternal Data Has patient been taught Hand Expression?: Yes Does the patient have breastfeeding experience prior to this delivery?: No  Feeding Mother's Current Feeding Choice: Breast Milk and Formula  LATCH Score                    Lactation Tools Discussed/Used    Interventions Interventions: Breast feeding basics reviewed;Education  Discharge Pump: Personal  Consult Status Consult Status: Follow-up Date: 11/22/21 Follow-up type: In-patient    [name] 11/21/2021, 6:28 AM

## 2021-11-22 MED ORDER — ACETAMINOPHEN 325 MG PO TABS: 1000.0000 mg | ORAL_TABLET | Freq: Three times a day (TID) | ORAL | 0 refills | Status: AC | PRN

## 2021-11-22 MED ORDER — IBUPROFEN 600 MG PO TABS: 600.0000 mg | ORAL_TABLET | Freq: Four times a day (QID) | ORAL | 0 refills | Status: AC | PRN

## 2021-11-22 MED ORDER — NORETHINDRONE 0.35 MG PO TABS
1.0000 | ORAL_TABLET | Freq: Every day | ORAL | 5 refills | Status: DC
Start: 2021-11-22 — End: 2021-12-18

## 2021-11-22 NOTE — Progress Notes (Addendum)
CSW met with MOB and FOB at [facility] 's bedside. CSW offered to utilize interpreting services and MOB declined and requested that FOB assist with language barriers. The couple was polite, easy to engage and receptive to meeting with CSW. CSW explained CSW role and the couple invited MOB to complete assessment. CSW is completing assessment for Edinburgh Score of 12.  CSW reviewed [place] results and MOB confirmed that she answered the questions based on how she felt over the last 7 days.  MOB communicated that her sister in law experienced PPD and is familiar with the symptoms.  Per MOB and FOB they have a great support team that is willing to assist when needed. CSW provided education regarding Baby Blues vs PMADs and provided MOB with resources for mental health follow up.  CSW encouraged MOB to evaluate her mental health throughout the postpartum period with the use of the New Mom Checklist developed by Postpartum [facility] as well as the [place] Postnatal Depression Scale and notify a medical professional if symptoms arise. MOB presented with insight and awareness and did not demonstrate any acute MH symptoms.  CSW assessed for safety and MOB denied SI and HI.  The couple reported having all essential items to care for infant and they voiced feeling prepared for infant's discharge.   CSW will make a referral to [facility] to provided family with parenting education, child development education, and outpatient therapy.   CSW also provided the couple with resources for MOB to identify a PCP.  CSW also emailed hospital's financial counselor Candace Gallus) to complete a Medicaid application for infant.   There are no barriers to discharge.   Laurey Arrow, MSW, LCSW Clinical Social Work [phone]

## 2021-11-22 NOTE — Lactation Note (Signed)
This note was copied from a baby's chart. Lactation Consultation Note  Patient Name: Amber Torres QXIHW'T Date: 11/22/2021 Reason for consult: Mother's request;Primapara;1st time breastfeeding;Term Age:33 hours Consult was done in Spanish:  LC in to room prior to discharge. Mother states baby seems to be feeding well but he is still very fussy after feedings. LC reviewed hand expression able to get a few drops of colostrum. Provided a hand pump and got same results. Baby started showing hunger cues. Assisted with latch and noted lack of seal with bottom lip. LC assisted with some suck training. Baby was disorganized and unable to seal. Parents agreed to use some formula out of concern for deficient transferring. LC demonstrated pace bottle-feeding, frequent burping, upright position and volume guidelines for formula feeding, if needed. Discussed normal behavior and patterns after 24h, voids and stools as signs good intake, pumping, clusterfeeding, skin to skin. Talked about milk coming into volume and managing engorgement.   Plan: 1-Feeding on demand or 8-12 times in 24h period. 2-Hand express/pump as needed for supplementation 3-If needed, pace bottle-feeding, frequent burping, upright position and volume guidelines for formula feeding 4-Encouraged maternal rest, hydration and food intake.   Contact LC as needed for feeds/support/concerns/questions. All questions answered at this time. Reviewed LC brochure.    Maternal Data Has patient been taught Hand Expression?: Yes Does the patient have breastfeeding experience prior to this delivery?: No  Feeding Mother's Current Feeding Choice: Breast Milk and Formula  LATCH Score Latch: Grasps breast easily, tongue down, lips flanged, rhythmical sucking. (lack of seal at bottom lip)  Audible Swallowing: None  Type of Nipple: Everted at rest and after stimulation  Comfort (Breast/Nipple): Soft / non-tender (dense tissue)  Hold  (Positioning): Assistance needed to correctly position infant at breast and maintain latch.  LATCH Score: 7   Lactation Tools Discussed/Used Tools: Pump;Flanges Flange Size: 21 Breast pump type: Manual Reason for Pumping: supplementation Pumped volume:  (drop)  Interventions Interventions: Breast feeding basics reviewed;Assisted with latch;Skin to skin;Hand express;Breast massage;Breast compression;Adjust position;Hand pump;Expressed milk;Position options;Support pillows;Education;Pace feeding;LC Services brochure  Discharge Discharge Education: Engorgement and breast care;Warning signs for feeding baby Pump: Manual;Personal WIC Program: Yes  Consult Status Consult Status: Complete Date: 11/22/21 Follow-up type: Call as needed    Shmiel Morton A Higuera Ancidey 11/22/2021, 1:51 PM

## 2021-11-22 NOTE — Progress Notes (Signed)
Post Partum Day 1 Subjective: Amber Torres  is a 31 y.o. G1P1001 s/p SVD at [redacted]w[redacted]d.  She reports she is doing well. No acute events overnight. She denies any problems with ambulating, voiding or po intake. Denies nausea or vomiting.  Pain is well controlled on ibuprofen.  Lochia is moderate and improving.   Objective: Blood pressure 115/77, pulse 82, temperature 98 F (36.7 C), temperature source Oral, resp. rate 18, height 5\' 2"  (1.575 m), weight 68.6 kg, last menstrual period 02/03/2021, SpO2 100 %, unknown if currently breastfeeding.  Physical Exam:  General: alert, cooperative, fatigued, and no distress Lochia: appropriate Uterine Fundus: firm Incision: n/a DVT Evaluation: No evidence of DVT seen on physical exam. Negative Homan's sign. No cords or calf tenderness. No significant calf/ankle edema.  Recent Labs    11/20/21 2018  HGB 13.6  HCT 39.3    Assessment/Plan: Plan for discharge tomorrow, Breastfeeding, Social Work consult, and Contraception PoPs   LOS: 2 days   2019, CNM 11/22/2021, 6:59 AM

## 2021-11-27 ENCOUNTER — Encounter: Payer: Self-pay | Admitting: Licensed Clinical Social Worker

## 2021-11-27 ENCOUNTER — Encounter: Payer: Self-pay | Admitting: Obstetrics and Gynecology

## 2021-11-27 ENCOUNTER — Telehealth: Payer: Self-pay | Admitting: Licensed Clinical Social Worker

## 2021-11-27 NOTE — Telephone Encounter (Signed)
Called pt twice regarding scheduled mychart visit. Unable to leave message due to mailbox is full

## 2021-12-01 ENCOUNTER — Telehealth: Payer: Self-pay | Admitting: Obstetrics and Gynecology

## 2021-12-01 NOTE — Telephone Encounter (Signed)
Received a message from after hours nurse line that patient had called about needing to be seen for her depression. Her husband called and requested an appointment for her. I attempted to reach the patient with interpreter number [phone]. He left a voice message for her to call the office to get scheduled. Patient missed her appointment she had scheduled.

## 2021-12-04 ENCOUNTER — Telehealth (HOSPITAL_COMMUNITY): Payer: Self-pay

## 2021-12-04 DIAGNOSIS — Z1331 Encounter for screening for depression: Secondary | ICD-10-CM

## 2021-12-04 NOTE — Telephone Encounter (Signed)
No answer. Left message to return nurse call.  Hospital EPDS score on 11/21/21 was 12.  Patient has reached out to MD office with concerns about depression. Referral placed for integrated behavioral health.   Marcelino Duster [facility] 12/04/2021,1659

## 2021-12-18 ENCOUNTER — Ambulatory Visit (INDEPENDENT_AMBULATORY_CARE_PROVIDER_SITE_OTHER): Payer: Self-pay | Admitting: Obstetrics and Gynecology

## 2021-12-18 ENCOUNTER — Encounter: Payer: Self-pay | Admitting: Obstetrics and Gynecology

## 2021-12-18 ENCOUNTER — Other Ambulatory Visit: Payer: Self-pay

## 2021-12-18 ENCOUNTER — Ambulatory Visit (INDEPENDENT_AMBULATORY_CARE_PROVIDER_SITE_OTHER): Payer: Self-pay | Admitting: Licensed Clinical Social Worker

## 2021-12-18 DIAGNOSIS — Z3041 Encounter for surveillance of contraceptive pills: Secondary | ICD-10-CM

## 2021-12-18 DIAGNOSIS — Z72820 Sleep deprivation: Secondary | ICD-10-CM

## 2021-12-18 DIAGNOSIS — Z758 Other problems related to medical facilities and other health care: Secondary | ICD-10-CM

## 2021-12-18 DIAGNOSIS — Z789 Other specified health status: Secondary | ICD-10-CM

## 2021-12-18 DIAGNOSIS — F53 Postpartum depression: Secondary | ICD-10-CM

## 2021-12-18 MED ORDER — NORETHINDRONE 0.35 MG PO TABS: 1.0000 | ORAL_TABLET | Freq: Every day | ORAL | 11 refills | Status: AC

## 2021-12-18 NOTE — Progress Notes (Signed)
Post Partum Visit Note  Amber Torres is a 31 y.o. G70P1001 female who presents for a postpartum visit. She is 4 weeks postpartum following a normal spontaneous vaginal delivery.  I have fully reviewed the prenatal and intrapartum course. The delivery was at [redacted]w[redacted]d gestational weeks.  Anesthesia: epidural. Postpartum course has been complicated by sleep deprivation, feelings of not being happy. Baby is doing well, but lots of colic. Baby cries quite often during the day; especially during the night. Baby is feeding by both breast and bottle - Gerber Gentle . Bleeding thin lochia. Bowel function is  constipated . Bladder function is normal. Patient is not sexually active. Contraception method is oral progesterone-only contraceptive. Postpartum depression screening: positive: score 16.   The pregnancy intention screening data noted above was reviewed. Potential methods of contraception were discussed. The patient elected to proceed with No data recorded.   Edinburgh Postnatal Depression Scale - 12/18/21 1034       Edinburgh Postnatal Depression Scale:  In the Past 7 Days   I have been able to laugh and see the funny side of things. 1    I have looked forward with enjoyment to things. 1    I have blamed myself unnecessarily when things went wrong. 2    I have been anxious or worried for no good reason. 3    I have felt scared or panicky for no good reason. 2    Things have been getting on top of me. 2    I have been so unhappy that I have had difficulty sleeping. 1    I have felt sad or miserable. 2    I have been so unhappy that I have been crying. 2    The thought of harming myself has occurred to me. 0    Edinburgh Postnatal Depression Scale Total 16             Health Maintenance Due  Topic Date Due   TETANUS/TDAP  Never done   PAP SMEAR-Modifier  Never done   INFLUENZA VACCINE  Never done    The following portions of the patient's history were reviewed and updated as  appropriate: allergies, current medications, past family history, past medical history, past social history, past surgical history, and problem list.  Review of Systems Constitutional: negative Eyes: negative Ears, nose, mouth, throat, and face: negative Respiratory: negative Cardiovascular: negative Gastrointestinal: negative Genitourinary:negative Integument/breast: negative Hematologic/lymphatic: negative Musculoskeletal:negative Neurological: negative Behavioral/Psych: positive for anxiety, depression, fatigue, loss of interest in favorite activities, and sleep disturbance Endocrine: negative Allergic/Immunologic: negative  Objective:  BP 99/62 (BP Location: Left Arm, Patient Position: Sitting, Cuff Size: Normal)    Pulse 76    Temp (!) 97.3 F (36.3 C) (Oral)    Ht 5\' 2"  (1.575 m)    Wt 131 lb (59.4 kg)    LMP 02/03/2021    Breastfeeding Yes    BMI 23.96 kg/m    General:  alert, cooperative, and depressed mood   Breasts:  not indicated  Lungs: Normal effort  Heart:  Not done  Abdomen: Not done    Wound N/a  GU exam:  not indicated       Assessment:   Encounter for postpartum care of lactating mother - Normal postpartum exam.   Postpartum depression    Plan:   Essential components of care per ACOG recommendations:  1.  Mood and well being: Patient with positive depression screening today. Reviewed local resources for support. Patient to  meet with Gwyndolyn Saxon, LCSW - Patient tobacco use? No.   - hx of drug use? No.    2. Infant care and feeding:  -Patient currently breastmilk feeding? Yes. Discussed returning to work and pumping. Reviewed importance of draining breast regularly to support lactation.  -Social determinants of health (SDOH) reviewed in EPIC. No concerns  3. Sexuality, contraception and birth spacing - Patient does not want a pregnancy in the next year.  Desired family size is 1 children.  - Reviewed forms of contraception in tiered fashion.  Patient desired oral progesterone-only contraceptive today.   - Discussed birth spacing of 18 months  4. Sleep and fatigue -Encouraged family/partner/community support of 4 hrs of uninterrupted sleep to help with mood and fatigue  5. Physical Recovery  - Discussed patients delivery and complications. She describes her labor as good. - Patient had a Vaginal, no problems at delivery. Patient had a 1st degree laceration. Perineal healing reviewed. Patient expressed understanding - Patient has urinary incontinence? No. - Patient is safe to resume physical and sexual activity  6.  Health Maintenance - HM due items addressed Yes - Last pap smear No results found for: DIAGPAP Pap smear not done at today's visit. Message sent to [facility] to get patient scheduled there for pap. -Breast Cancer screening indicated? No.   7. Chronic Disease/Pregnancy Condition follow up: None  - PCP follow up  [name] Mora, CNM [facility] [employer], [facility]

## 2021-12-18 NOTE — Patient Instructions (Signed)
Depresin perinatal Perinatal Depression Cuando una mujer siente tristeza, ira o ansiedad en exceso durante el embarazo o durante los primeros 12 meses posteriores al parto presenta una afeccin denominada depresin perinatal. Esto puede afectar el desempeo laboral o acadmico, las relaciones personales y Sid Falcon actividades cotidianas. Si no se controla de [place], tambin puede interferir con la capacidad de la mujer para cuidar al beb. Los sntomas de depresin perinatal pueden empeorar cuando se vive con un recin nacido. En ocasiones, estos sntomas no se tratan debido ya que se considera que son cambios en el estado de nimo normales durante e inmediatamente despus del [place]. Sin embargo, si tiene sntomas intensos de depresin que duran ms de 2 semanas, es importante que hable con su mdico. Esto puede ser depresin perinatal. Cules son las causas? Se desconoce la causa exacta de esta afeccin. Es posible que los cambios hormonales durante y despus del embarazo estn relacionados con la causa de la depresin perinatal. Sander Nephew incrementa el riesgo? Es ms probable que sufra esta afeccin si: Tiene antecedentes personales o familiares de depresin, ansiedad o trastornos del estado de nimo. Sufre un suceso estresante de la vida durante el [place], como la muerte de un ser querido. Tiene un nivel adicional de estrs en la vida, como ser [employer]. No tiene apoyo de familiares o seres queridos, o est en una relacin de [place]. Tiene problemas de tiroides. Cules son los signos o sntomas? Los sntomas de esta afeccin incluyen: Sntomas emocionales, como: Sentimientos de tristeza y desesperanza. Sentimientos de culpa. Sentirse irritable o abrumada. Sntomas fsicos, como: Cambios con el apetito o el sueo. Falta de energa o motivacin. Dolores de cabeza o problemas estomacales persistentes. Sntomas conductuales, como: Dificultad para concentrarse o completar las  tareas. Prdida de inters en los pasatiempos o las relaciones. Cmo se diagnostica? Esta afeccin se diagnostica en funcin de un examen fsico y [place] evaluacin mental. En algunos casos, el mdico puede usar una herramienta de deteccin de la depresin. Esta herramienta incluye una lista de preguntas que pueden ayudar al mdico a diagnosticar la depresin. Es posible que se la derive a un experto en salud mental especializado en el tratamiento de la depresin perinatal. Cmo se trata? El tratamiento de esta afeccin puede incluir: Psicoterapia con un profesional de la salud mental. Puede ser psicoterapia interpersonal, terapia para parejas, terapia cognitivo-conductual o terapia de vnculo madre-hijo. Medicamentos. El Animator la seguridad de los medicamentos recetados durante el embarazo y Comptroller. Grupos de apoyo. Estimulacin cerebral o terapias con luz. Terapias de reduccin del estrs, como el mtodo de la conciencia [place]. Siga estas instrucciones en su casa: Estilo de vida No consuma ningn producto que contenga nicotina o tabaco. Estos productos incluyen cigarrillos, tabaco para Higher education careers adviser y aparatos de vapeo, como los Psychologist, sport and exercise. Si necesita ayuda para dejar de consumir estos productos, consulte al mdico. Si est embarazada, no beba alcohol. Tambin es seguro no beber alcohol si est amamantando. Despus de que nazca el beb, si bebe alcohol: Limite la cantidad que consume de 0 a 1 medida por da. Est atenta a la cantidad de alcohol que hay en las bebidas que toma. En los [place], una medida equivale a una botella de cerveza de 12 oz (355 ml), un vaso de vino de 5 oz (148 ml) o un vaso de una bebida alcohlica de alta graduacin de 1 oz (44 ml). Considere la posibilidad de Chief Financial Officer en un grupo de apoyo para mams recientes. Pdale recomendaciones al mdico. Dava Najjar  adecuadamente. Asegrese de hacer lo siguiente: Duerma todo lo que pueda. Hable con  su pareja acerca de compartir la responsabilidad de levantarse con el beb, si esto es posible, y de compartir la responsabilidad de [place] equitativa. Haga del dormir una prioridad. Siga una dieta saludable. Esto incluye muchas frutas y verduras, cereales integrales y protenas magras. Realice ejercicio con regularidad como se lo haya indicado el mdico. Consulte al mdico qu ejercicios son seguros para usted. Hable con su pareja para asegurarse de que ambos tengan oportunidades de hacer ejercicio. Indicaciones generales Use los medicamentos de venta libre y los recetados solamente como se lo haya indicado el mdico. Hable con su pareja o familiares acerca de sus sentimientos durante el embarazo. Comparta cualquier inquietud, necesidad o ansiedad que tenga. No tenga miedo de pedir ayuda. Si es necesario, busque un profesional de salud mental. Solicite ayuda para Optometrist las tareas domsticas cuando la necesite. Pdales a los amigos y familiares que le preparen las comidas, le cuiden a los nios o la ayuden con la limpieza. Cumpla con todas las visitas de seguimiento. Esto es importante. Comunquese con un mdico si: Usted o las personas allegadas observan que tiene sntomas de depresin. Sus sntomas de Web designer. Toma medicamentos y experimenta efectos secundarios como nuseas o problemas para dormir. Solicite ayuda de inmediato si: Siente deseos de Sport and exercise psychologist al beb o a Nurse, children's. Si siente que puede hacerse dao a usted misma o daar a otros, o tiene pensamientos de poner fin a su vida, busque ayuda de inmediato. Puede dirigirse al departamento de emergencias ms cercano o bien: Comunquese con el servicio de emergencias de su localidad (911 en los [place]). Llame a una lnea de asistencia al suicida y atencin en crisis como National Suicide Prevention Lifeline ([place]) al 6037743178. Est disponible las 24 horas del da en  los [place] Enve un mensaje de texto a la lnea para casos de crisis al [phone] (en los [place] Resumen La depresin perinatal se produce cuando una mujer siente tristeza, ira o ansiedad en exceso durante el embarazo o durante los primeros 12 meses posteriores al parto. Si la depresin perinatal no se controla de [place], puede interferir con la capacidad de la mujer para cuidar al beb. Esta afeccin se trata con medicamentos, psicoterapia, terapias de reduccin del estrs o una combinacin de tratamientos. Hable con su pareja o familiares acerca de sus sentimientos. Pida ayuda cuando la necesite. Esta informacin no tiene Marine scientist el consejo del mdico. Asegrese de hacerle al mdico cualquier pregunta que tenga. Document Revised: 05/23/2020 Document Reviewed: 05/23/2020 Elsevier Patient Education  [place].

## 2021-12-20 ENCOUNTER — Other Ambulatory Visit: Payer: Self-pay

## 2021-12-20 ENCOUNTER — Inpatient Hospital Stay (HOSPITAL_COMMUNITY)
Admit: 2021-12-20 | Discharge: 2021-12-20 | Disposition: A | Discharge status: Home / Self Care | Payer: Self-pay | Attending: Obstetrics & Gynecology | Admitting: Obstetrics & Gynecology

## 2021-12-20 DIAGNOSIS — O906 Postpartum mood disturbance: Secondary | ICD-10-CM | POA: Insufficient documentation

## 2021-12-20 DIAGNOSIS — O9089 Other complications of the puerperium, not elsewhere classified: Secondary | ICD-10-CM | POA: Insufficient documentation

## 2021-12-20 DIAGNOSIS — M5431 Sciatica, right side: Secondary | ICD-10-CM | POA: Insufficient documentation

## 2021-12-20 NOTE — MAU Provider Note (Signed)
[name] is a 31 y.o. G1P1001 patient who presents to MAU today with complaint of leg pain. She reports it is intermittent and sharp and shooting down the back of her leg. She also reports that she was visited by the home nurse who told her to come in because her PP depression score was high. She denies any SI or HI.   O BP 99/62 (BP Location: Right Arm)    Pulse 73    Temp 97.6 F (36.4 C) (Oral)    Resp 17    SpO2 96%  Physical Exam Vitals and nursing note reviewed.  Constitutional:      General: She is not in acute distress.    Appearance: She is well-developed.  HENT:     Head: Normocephalic.  Eyes:     Pupils: Pupils are equal, round, and reactive to light.  Cardiovascular:     Rate and Rhythm: Normal rate and regular rhythm.     Heart sounds: Normal heart sounds.  Pulmonary:     Effort: Pulmonary effort is normal. No respiratory distress.     Breath sounds: Normal breath sounds.  Abdominal:     General: Bowel sounds are normal. There is no distension.     Palpations: Abdomen is soft.     Tenderness: There is no abdominal tenderness.  Skin:    General: Skin is warm and dry.  Neurological:     Mental Status: She is alert and oriented to person, place, and time.  Psychiatric:        Mood and Affect: Mood normal.        Behavior: Behavior normal.        Thought Content: Thought content normal.        Judgment: Judgment normal.    A Medical screening exam complete 1. Sciatica of right side   2. Postpartum mood disturbance    No signs of DVT. Discussed this is likely sciatica and options for pain relief reviewed.   PP depression screening has been elevated since inpatient. Patient denies SI, HI but states she is struggling with self care and not sleeping well. Discussed normal vs abnormal pp mood changes. Offered medication management but patient declines. Discussed when to return for evaluation at length  P Discharge from MAU in stable condition List of  options for follow-up given  Warning signs for worsening condition that would warrant emergency follow-up discussed Patient may return to MAU as needed   Rolm Bookbinder, [place] 12/20/2021 1:45 PM

## 2021-12-20 NOTE — MAU Note (Addendum)
Pt reports to mau with c/o right leg pain that starts at hip and radiates to knee and foot.  Pt states pain feels like her leg is going numb.  On going for 1 week.  Reports she took tylenol yesterday.  Pt states standing and walking makes pain worse.     Pt states home nurse told her she was concerned about her PP depression score and told her to come into MAU so that could be started on meds.  Denies SI or self harm.  Reports issues with sleeping, hygiene, and feeling sad.

## 2021-12-22 NOTE — BH Specialist Note (Signed)
Integrated Behavioral Health Follow Up In-Person Visit  MRN: [id number] Name: Amber Torres  Number of Integrated Behavioral Health Clinician visits: 2 Session Start time:  1100am Session End time: 1115am Total time in minutes: 15 mins in person at [facility]   Types of Service: General Behavioral Integrated Care (BHI)  Interpretor:No. Interpretor Name and Language: none  Subjective: Amber Torres is a 31 y.o. female accompanied by n/a Patient was referred by Carloyn Jaeger for CNM. Patient reports the following symptoms/concerns: sleep deprivation  Duration of problem: approx one month; Severity of problem: mild  Objective: Mood: Anxious and Affect: Appropriate Risk of harm to self or others: No plan to harm self or others  Life Context: Family and Social: Live with partner and newborn  School/Work: Works full time  Self-Care: n/a Life Changes: Newborn   Patient and/or Family's Strengths/Protective Factors: Concrete supports in place (healthy food, safe environments, etc.)  Goals Addressed: Patient will:  Reduce symptoms of: anxiety and stress   Increase knowledge and/or ability of: coping skills   Demonstrate ability to: Increase adequate support systems for patient/family  Progress towards Goals: Ongoing  Interventions: Interventions utilized:  Supportive Counseling Standardized Assessments completed: Edinburgh Postnatal Depression    Assessment: Patient currently experiencing sleep deprivation.   Patient may benefit from social support and outpatient behavioral health.   Plan: Follow up with behavioral health clinician on : as needed  Behavioral recommendations: communicate needs with partner, priortize rest.  Referral(s): Integrated [employer] (In Clinic) "From scale of 1-10, how likely are you to follow plan?":   Gwyndolyn Saxon, LCSW

## 2022-01-05 ENCOUNTER — Ambulatory Visit: Payer: Self-pay | Admitting: Internal Medicine

## 2022-01-12 IMAGING — US US OB < 14 WEEKS - US OB TV
1 series · 15 of 28 positions shown · non-contrast
Comparison: None.

CLINICAL DATA: Abdominal pain without vaginal bleeding,
quantitative beta hCG of 374

EXAM:
OBSTETRIC <14 WK ULTRASOUND
TECHNIQUE: Transabdominal ultrasound was performed for evaluation of the
gestation as well as the maternal uterus and adnexal regions.

[Series 1: us ob < 14 weeks - us ob tv · 41 acquisitions, 15 frames shown]
[im 1/41]
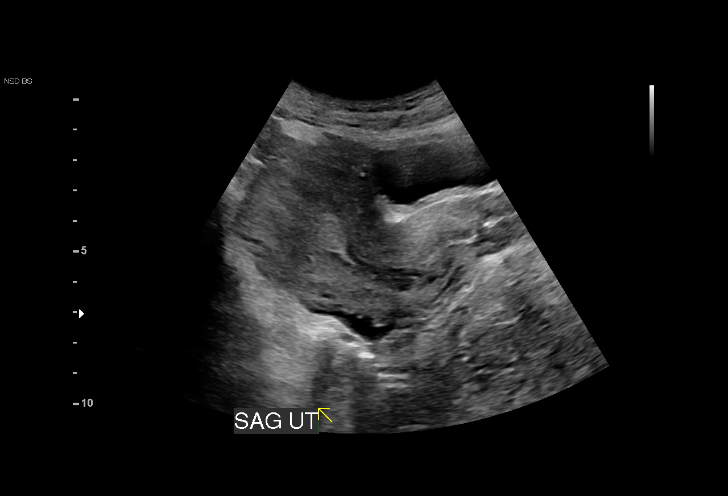
[im 3/41]
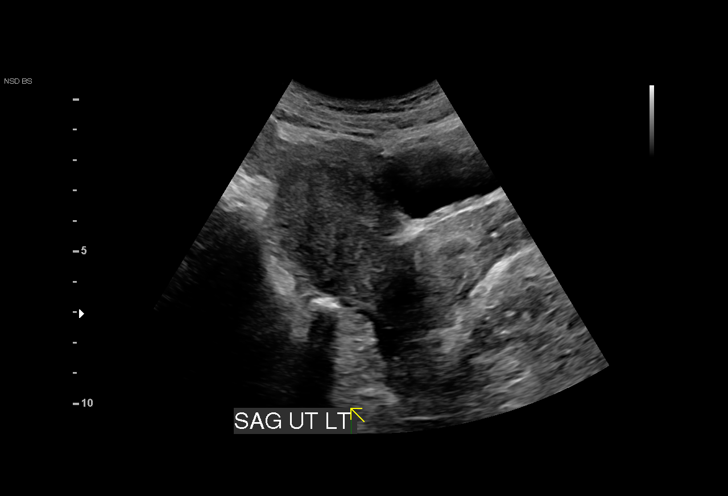
[im 6/41]
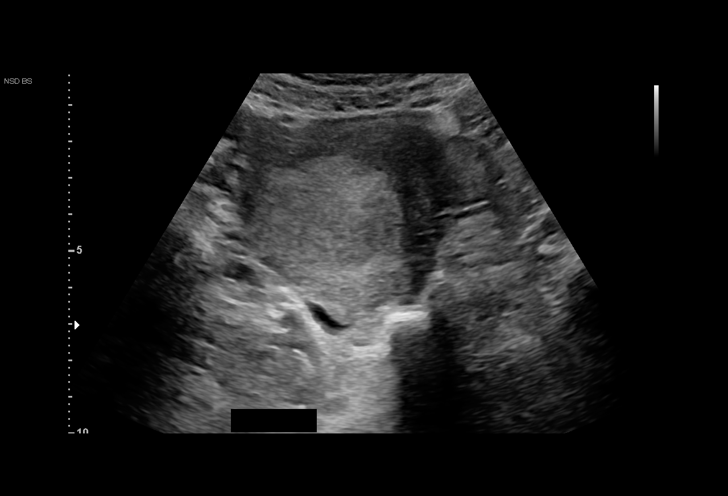
[im 9/41]
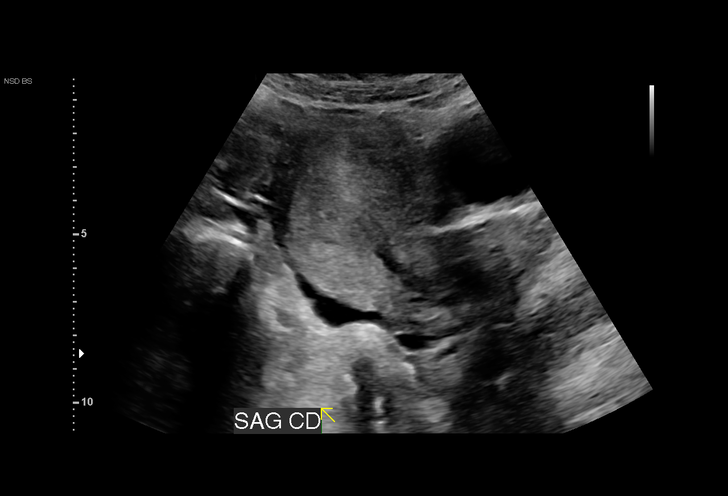
[im 12/41]
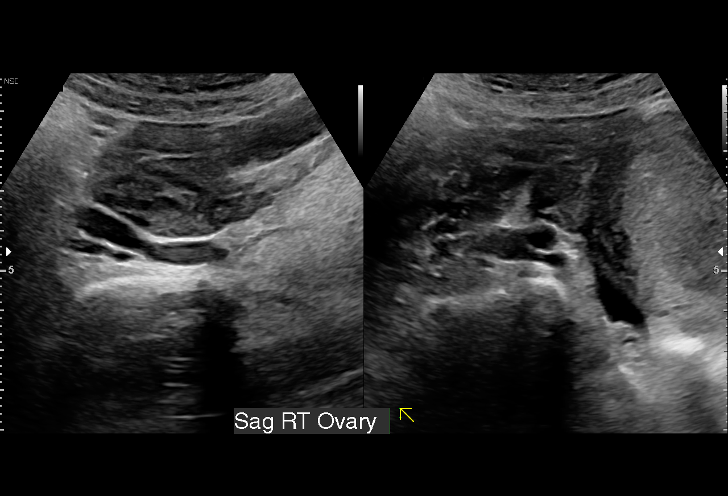
[im 15/41]
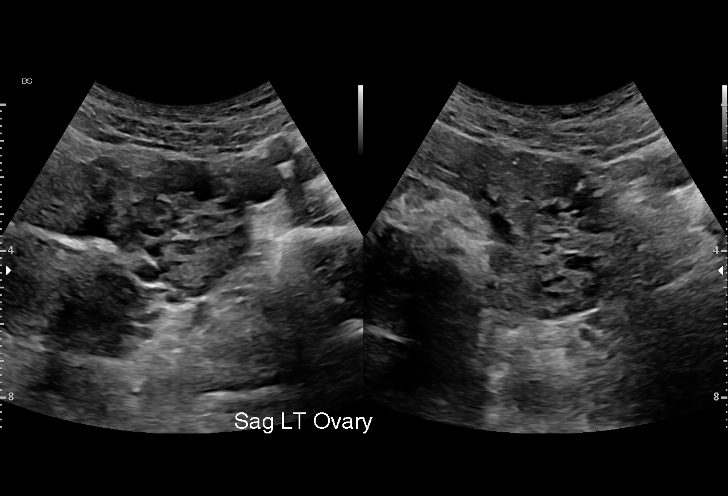
[im 18/41]
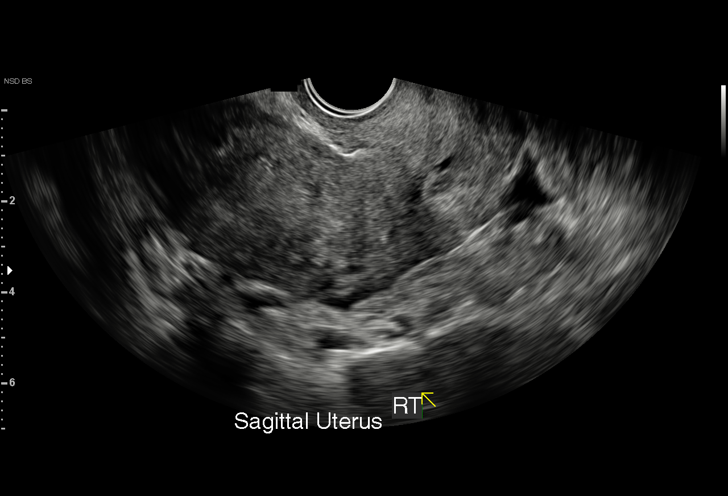
[im 21/41]
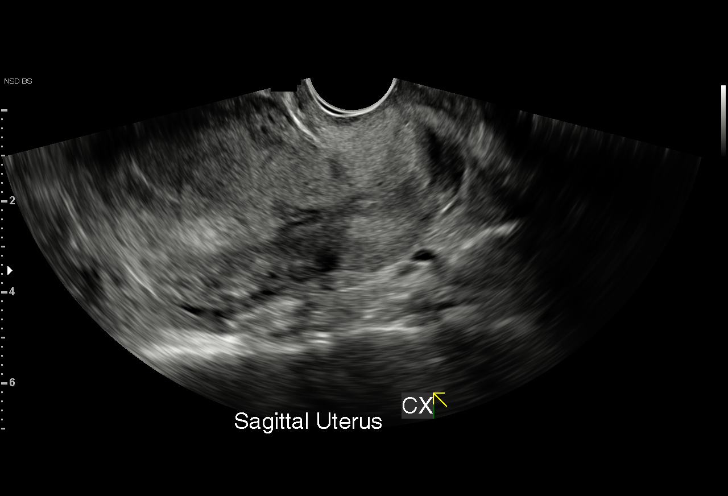
[im 23/41]
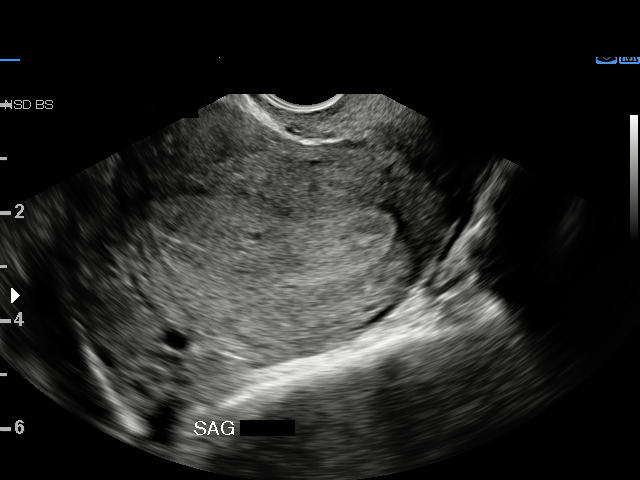
[im 26/41]
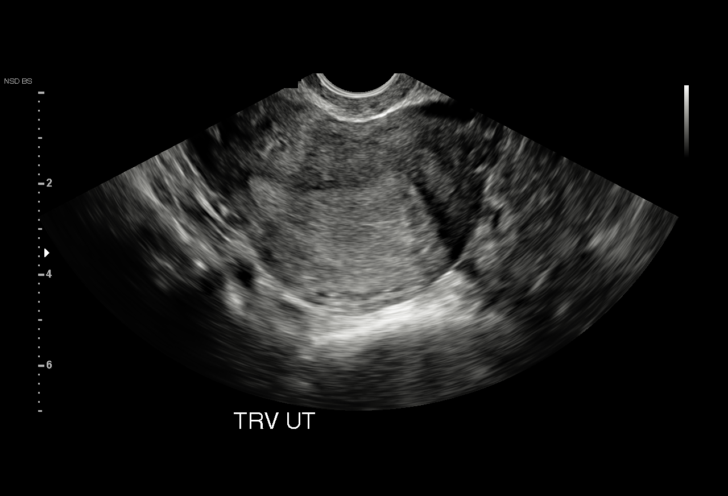
[im 29/41]
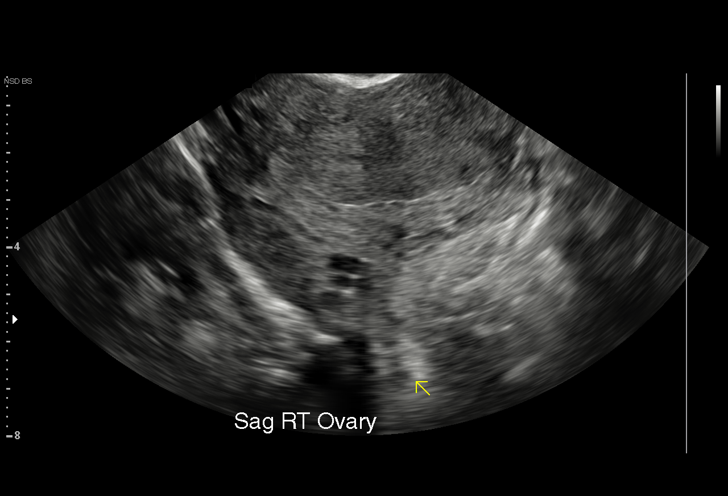
[im 32/41]
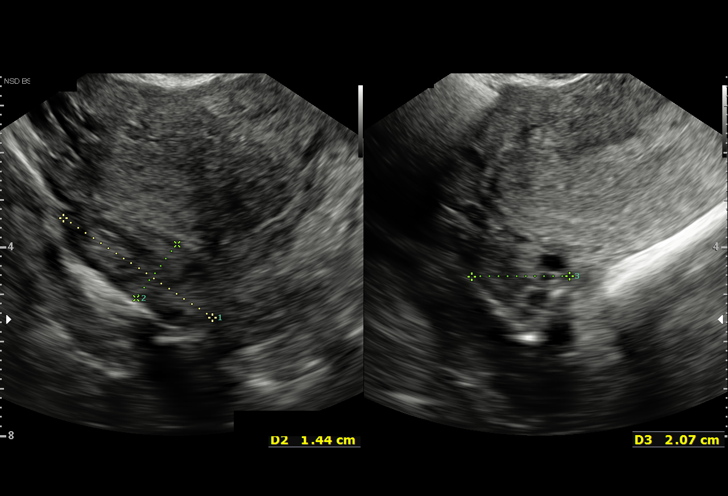
[im 35/41]
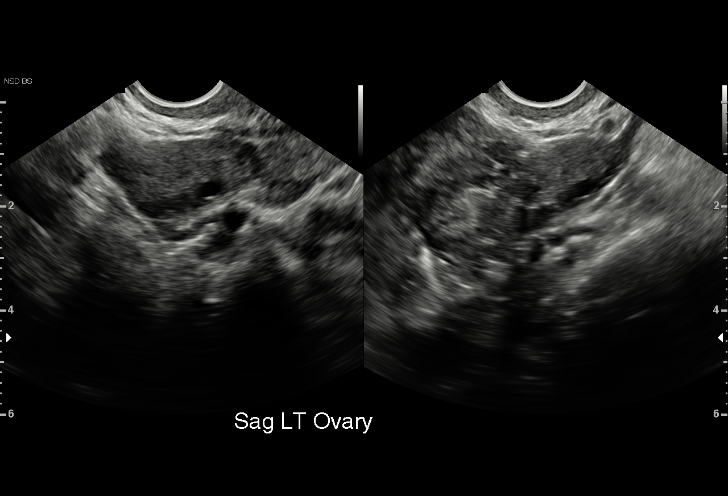
[im 38/41]
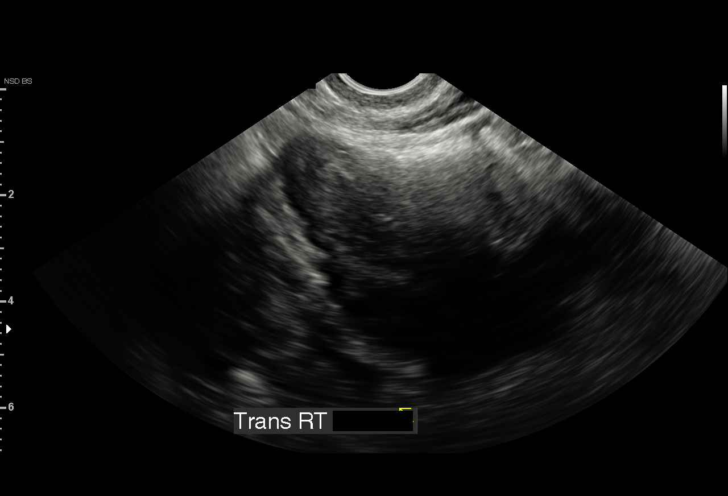
[im 41/41]
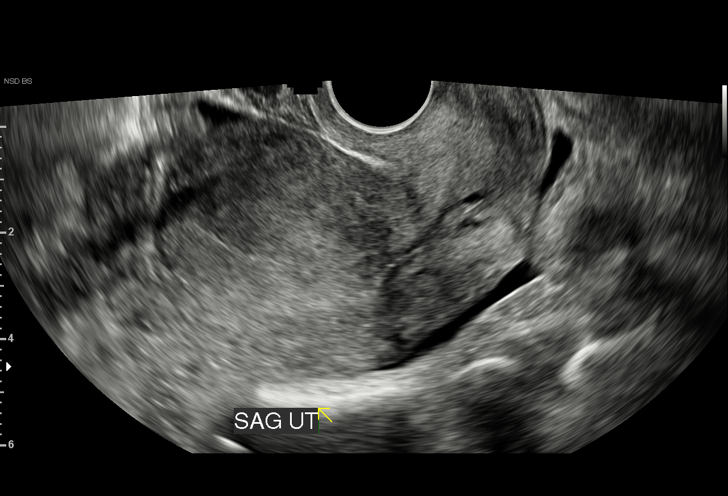

[15 of 28 positions shown; findings below may reference images not displayed]

FINDINGS: Intrauterine gestational sac: No definite gestational sac
visualized. However, there is a tiny cystic structure within the
endometrium measuring approximately 1.2 mm.

Yolk sac:  Not Visualized.

Embryo:  Not Visualized.

Cardiac Activity: Not Visualized.

Subchorionic hemorrhage:  None visualized.

Maternal uterus/adnexae: Small volume of simple appearing pelvic
free fluid.
IMPRESSION: Pregnancy of unknown location. In hemodynamically stable patient
recommend short-term interval follow-up with pelvic ultrasound and
serial beta HCG monitoring.

## 2022-01-31 IMAGING — US US OB COMP LESS 14 WK
1 series · 15 of 27 positions shown · non-contrast
Comparison: 01/13/2021

CLINICAL DATA: First trimester pregnancy, assessment of viability
and gestational age, uncertain LMP

EXAM:
OBSTETRIC <14 WK ULTRASOUND
TECHNIQUE: Transabdominal ultrasound was performed for evaluation of the
gestation as well as the maternal uterus and adnexal regions.

[Series 1: us ob comp less 14 wk · 27 acquisitions, 15 frames shown]
[im 1/27]
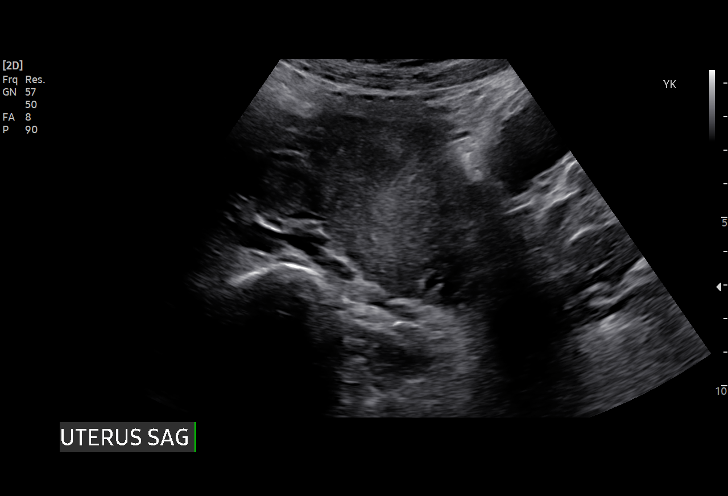
[im 3/27]
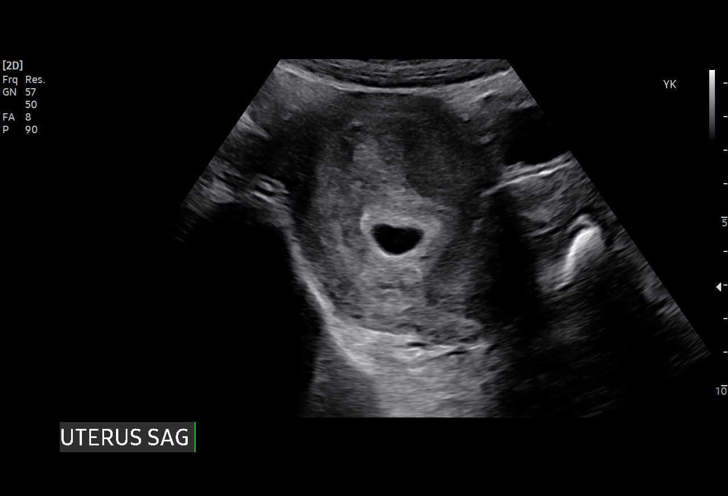
[im 5/27]
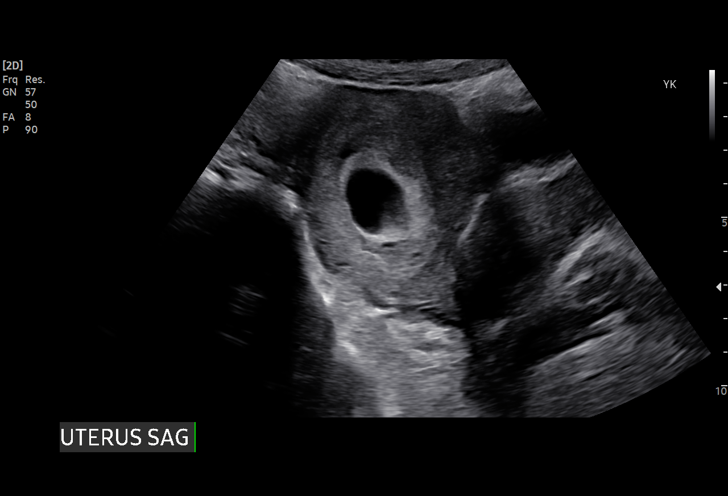
[im 7/27]
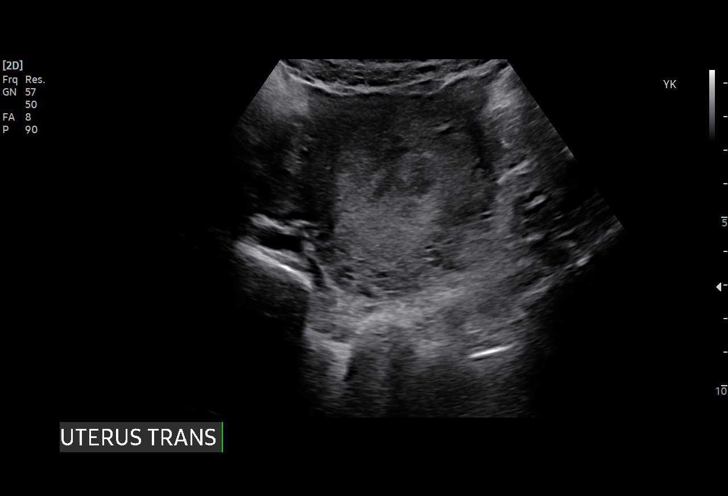
[im 9/27]
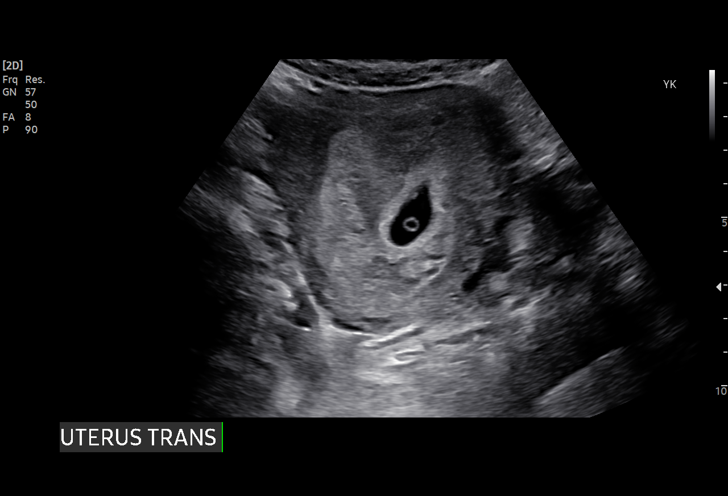
[im 10/27]
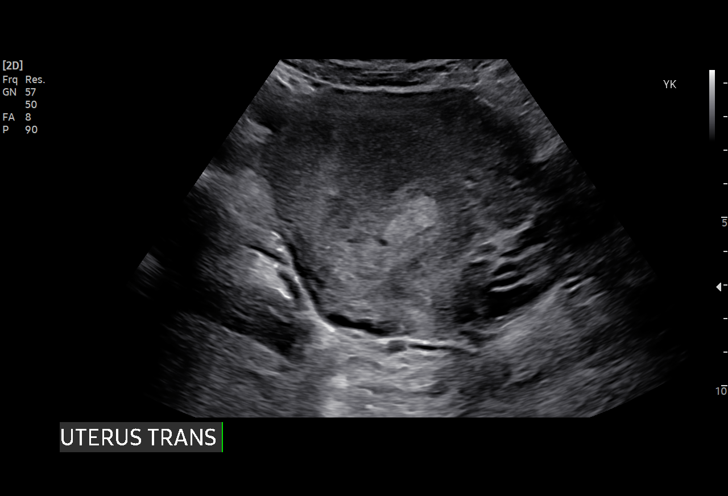
[im 12/27]
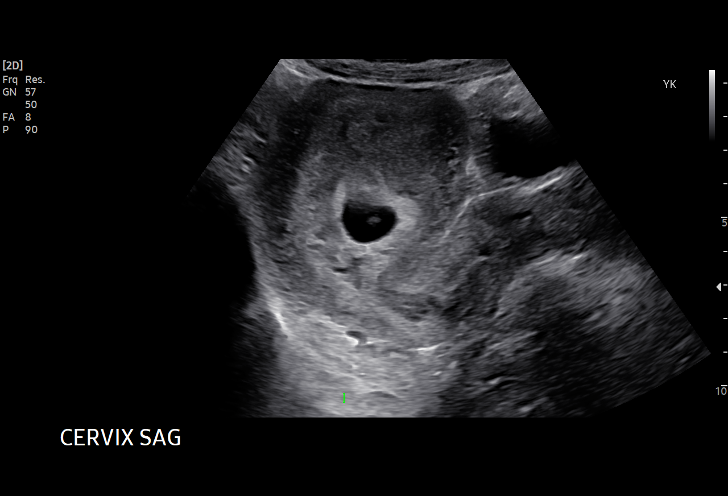
[im 14/27]
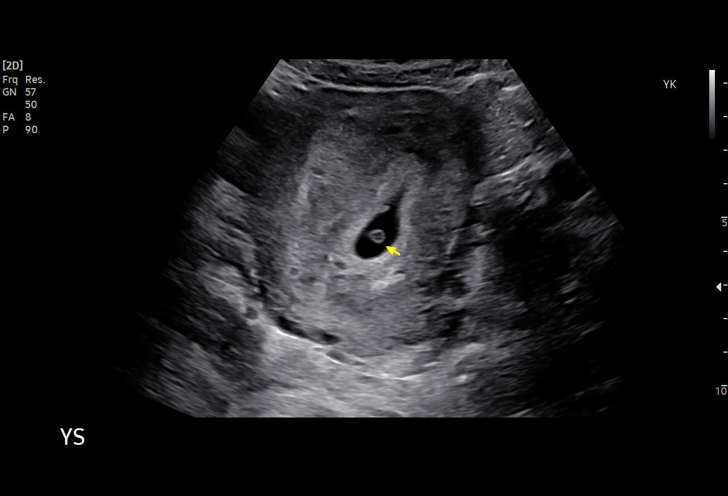
[im 16/27]
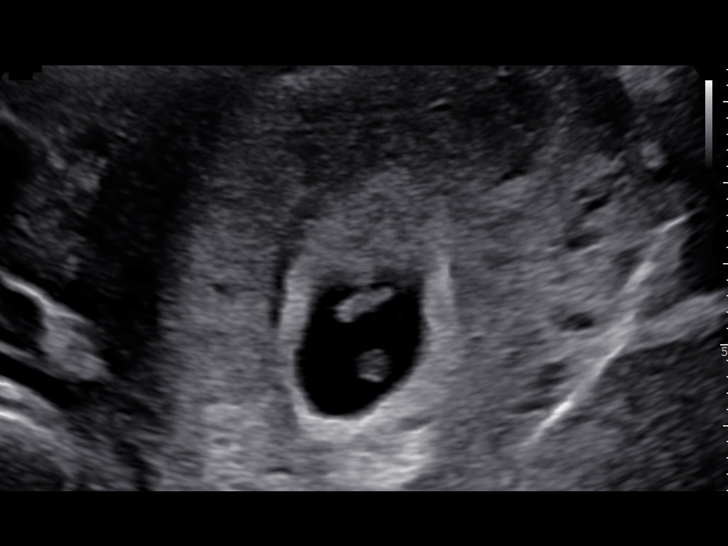
[im 18/27]
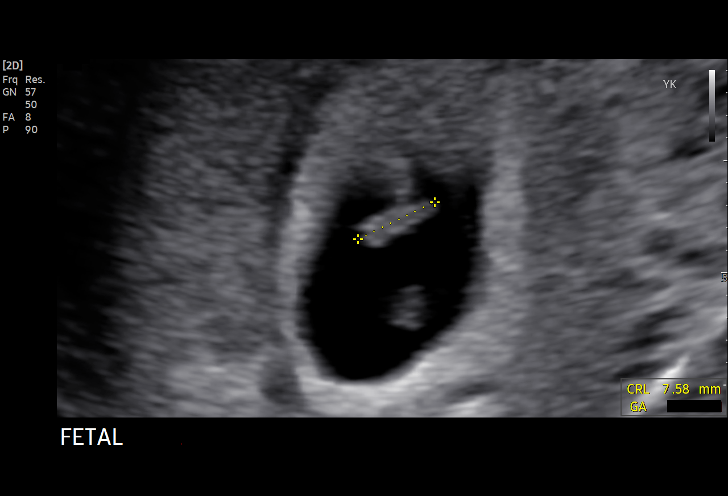
[im 19/27]
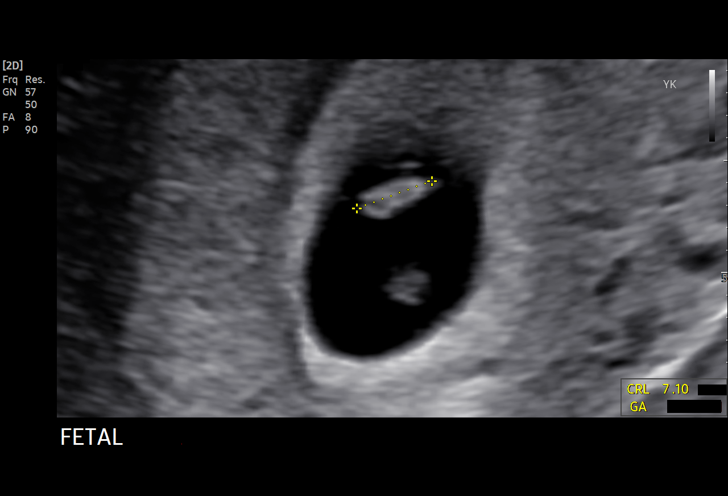
[im 21/27]
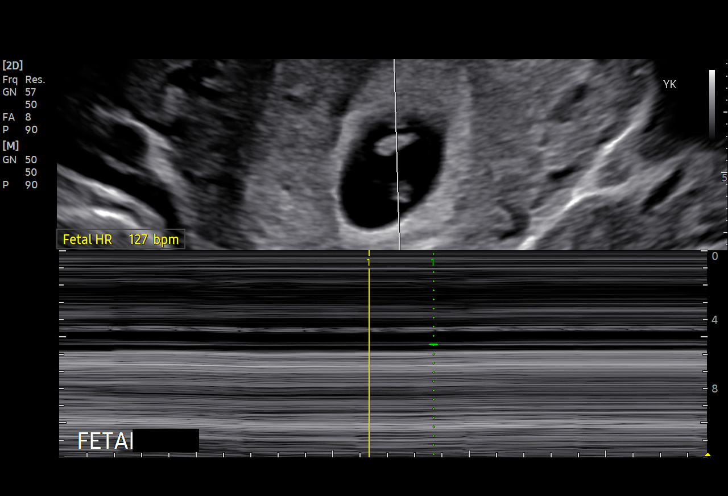
[im 23/27]
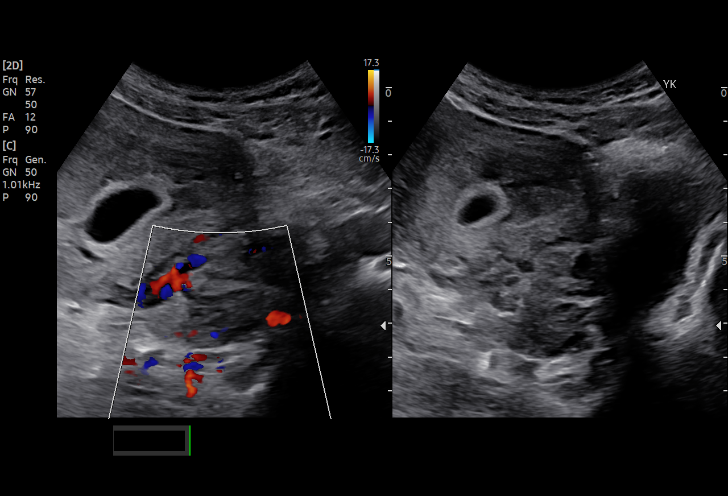
[im 25/27]
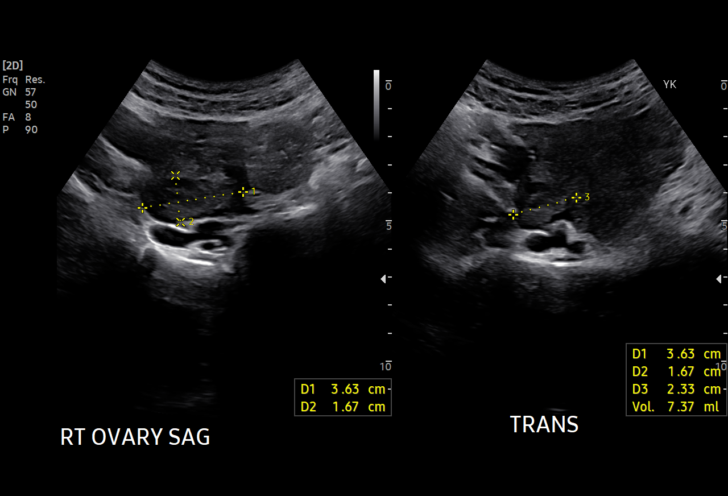
[im 27/27]
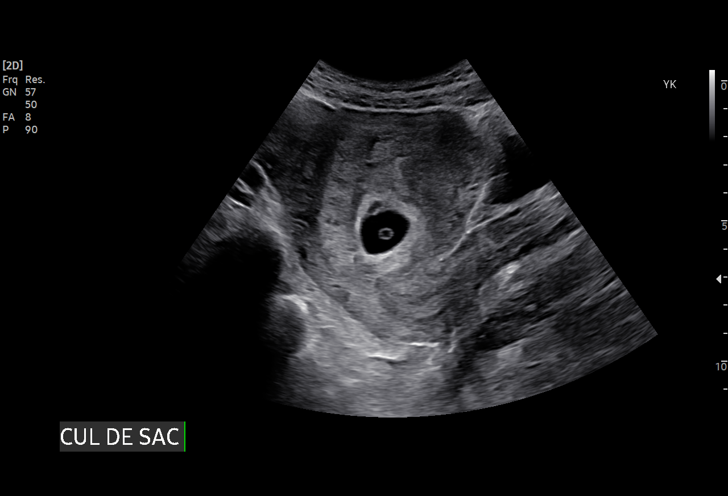

[15 of 27 positions shown; findings below may reference images not displayed]

FINDINGS: Intrauterine gestational sac: Present, single

Yolk sac:  Present

Embryo:  Present

Cardiac Activity: Present

Heart Rate: 125 bpm

CRL:   7.3 mm   6 w 4 d                  US EDC: 11/23/2021

Subchorionic hemorrhage:  None visualized.

Maternal uterus/adnexae:

Maternal uterus otherwise normal appearance.

RIGHT ovary normal size and morphology 3.6 x 2.3 x 1.7 cm.

LEFT ovary normal size and morphology 3.0 x 2.5 x 1.6 cm.

No free pelvic fluid.  No adnexal masses.
IMPRESSION: Single live intrauterine gestation at 6 weeks 4 days EGA by
crown-rump length.

No acute abnormalities.

## 2022-03-24 ENCOUNTER — Other Ambulatory Visit: Payer: Self-pay | Admitting: *Deleted

## 2022-03-24 DIAGNOSIS — Z124 Encounter for screening for malignant neoplasm of cervix: Secondary | ICD-10-CM

## 2022-03-24 NOTE — Progress Notes (Signed)
Patient: Amber Torres           Date of Birth: 04-Feb-1991           MRN: [id number] Visit Date: 03/24/2022 PCP: Pcp, No  Cervical Cancer Screening Do you smoke?: No Have you ever had or been told you have an allergy to latex products?: No Marital status: Married Date of last pap smear: 1-2 yrs ago Date of last menstrual period:  (BCP) Number of pregnancies: 1 Number of births: 1 Have you ever had any of the following? Hysterectomy: No Tubal ligation (tubes tied): No Abnormal bleeding: No Abnormal pap smear: No Venereal warts: No A sex partner with venereal warts: No A high risk* sex partner: No  Cervical Exam  Abnormal Observations: Normal Exam. Recommendations: Last Pap smear was completed 09/24/2020 at [facility] and normal per patient. Per patient her last Pap smear is the only Pap smear she has had completed. No Pap smear results are available in Epic. Let patient know if today's Pap smear is normal and HPV negative that her next Pap smear is due in 5 years. Informed patient will follow up with her within the next couple of weeks with results of her Pap smear by phone. Patient verbalized understanding.    Spanish interpreter Rudene Anda from [facility] provided.  Patient's History Patient Active Problem List   Diagnosis Date Noted   Postpartum depression 12/18/2021   Normal labor 11/20/2021   Vaginal delivery 11/20/2021   Supervision of normal first pregnancy, antepartum 10/13/2021   Past Medical History:  Diagnosis Date   Medical history non-contributory     No family history on file.  Social History   Occupational History   Not on file  Tobacco Use   Smoking status: Never    Passive exposure: Never   Smokeless tobacco: Never  Vaping Use   Vaping Use: Never used  Substance and Sexual Activity   Alcohol use: Never   Drug use: Never   Sexual activity: Yes    Birth control/protection: None

## 2022-03-27 LAB — CYTOLOGY - PAP
Comment: NEGATIVE
High risk HPV: POSITIVE — AB

## 2022-04-01 ENCOUNTER — Telehealth: Payer: Self-pay

## 2022-04-01 NOTE — Telephone Encounter (Signed)
Called patient via [employer] [phone] to discuss pap smear results with patient. 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 Informed patient that based on this result we will need to refer her to the [facility] for a colpo for follow-up. Answered any questions that patient had regarding result. Patient voiced understanding.

## 2022-05-13 IMAGING — DX DG CHEST 1V PORT
1 series · 1 of 1 positions shown · non-contrast
Comparison: None.

CLINICAL DATA: Productive cough.

EXAM:
PORTABLE CHEST 1 VIEW

[chest]
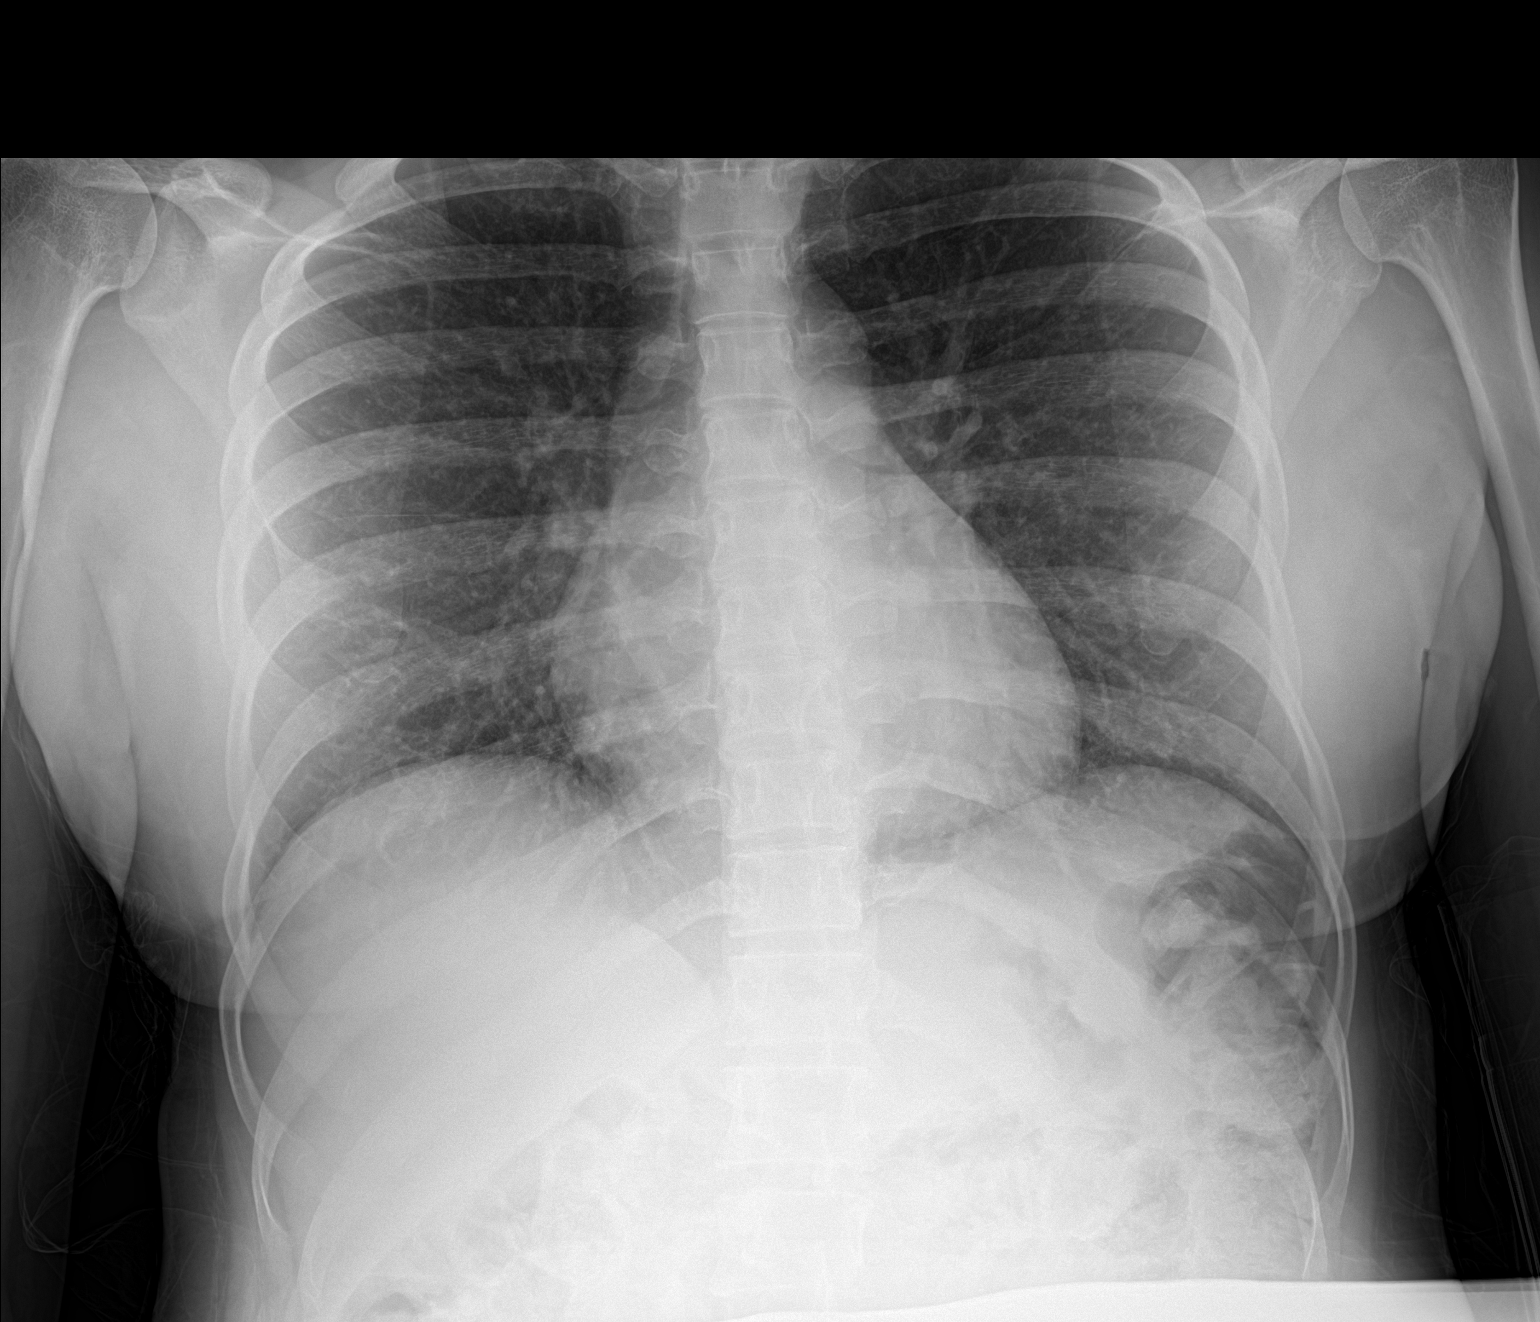

[1 of 1 positions shown; findings below may reference images not displayed]

FINDINGS: No focal consolidation, pleural effusion pneumothorax. The cardiac
silhouette is within normal limits. No acute osseous pathology.
IMPRESSION: No active disease.

## 2022-05-27 ENCOUNTER — Ambulatory Visit: Payer: 59 | Attending: Nurse Practitioner | Admitting: Nurse Practitioner

## 2022-05-27 ENCOUNTER — Encounter: Payer: Self-pay | Admitting: Nurse Practitioner

## 2022-05-27 VITALS — BP 100/63 | HR 75 | Temp 98.4°F | Resp 16 | Ht 62.0 in | Wt 127.7 lb

## 2022-05-27 DIAGNOSIS — Z7689 Persons encountering health services in other specified circumstances: Secondary | ICD-10-CM | POA: Diagnosis not present

## 2022-05-27 DIAGNOSIS — R109 Unspecified abdominal pain: Secondary | ICD-10-CM

## 2022-05-27 DIAGNOSIS — D72829 Elevated white blood cell count, unspecified: Secondary | ICD-10-CM | POA: Diagnosis not present

## 2022-05-27 LAB — POCT URINALYSIS DIP (CLINITEK)
Bilirubin, UA: NEGATIVE
Blood, UA: NEGATIVE
Glucose, UA: NEGATIVE mg/dL
Ketones, POC UA: NEGATIVE mg/dL
Leukocytes, UA: NEGATIVE
Nitrite, UA: NEGATIVE
POC PROTEIN,UA: NEGATIVE
Spec Grav, UA: 1.02 (ref 1.010–1.025)
Urobilinogen, UA: 0.2 E.U./dL
pH, UA: 6 (ref 5.0–8.0)

## 2022-05-27 NOTE — Patient Instructions (Signed)
[email or web address]  user name

## 2022-05-27 NOTE — Progress Notes (Signed)
No concerns, establish care  Zoar [phone]

## 2022-05-27 NOTE — Progress Notes (Signed)
Assessment & Plan:  Amber Torres was seen today for establish care.  Diagnoses and all orders for this visit:  Encounter to establish care  Serum calcium elevated -     Basic metabolic panel  Leukocytosis, unspecified type -     CBC with Differential  Left flank pain -     POCT URINALYSIS DIP (CLINITEK)    Patient has been counseled on age-appropriate routine health concerns for screening and prevention. These are reviewed and up-to-date. Referrals have been placed accordingly. Immunizations are up-to-date or declined.    Subjective:   Chief Complaint  Patient presents with   Establish Care    New Patient   HPI Amber Torres 31 y.o. female presents to office today to establish care.    She has an onsite interpreter accompanying her today.   Urinary Tract Infection: Patient complains of  left flank pain  She has had symptoms for 1 month. Patient also complains of back pain. Patient denies fever, stomach ache, and vaginal discharge. Patient does not have a history of recurrent UTI.  Patient does not have a history of pyelonephritis.    She has no other questions or concerns today.    Review of Systems  Constitutional:  Negative for fever, malaise/fatigue and weight loss.  HENT: Negative.  Negative for nosebleeds.   Eyes: Negative.  Negative for blurred vision, double vision and photophobia.  Respiratory: Negative.  Negative for cough and shortness of breath.   Cardiovascular: Negative.  Negative for chest pain, palpitations and leg swelling.  Gastrointestinal: Negative.  Negative for heartburn, nausea and vomiting.  Genitourinary:  Positive for flank pain. Negative for dysuria, frequency, hematuria and urgency.  Musculoskeletal:  Negative for myalgias.  Neurological: Negative.  Negative for dizziness, focal weakness, seizures and headaches.  Psychiatric/Behavioral: Negative.  Negative for suicidal ideas.     Past Medical History:  Diagnosis Date   Medical history  non-contributory     Past Surgical History:  Procedure Laterality Date   COLONOSCOPY  2021   NO PAST SURGERIES      History reviewed. No pertinent family history.  Social History Reviewed with no changes to be made today.   Outpatient Medications Prior to Visit  Medication Sig Dispense Refill   acetaminophen (TYLENOL) 325 MG tablet Take 3 tablets (975 mg total) by mouth every 8 (eight) hours as needed (pain). 60 tablet 0   ibuprofen (ADVIL) 600 MG tablet Take 1 tablet (600 mg total) by mouth every 6 (six) hours as needed (pain). 40 tablet 0   norethindrone (MICRONOR) 0.35 MG tablet Take 1 tablet (0.35 mg total) by mouth daily. For birth control. 28 tablet 11   Prenatal Vit-Fe Fumarate-FA (MULTIVITAMIN-PRENATAL) 27-0.8 MG TABS tablet Take 1 tablet by mouth daily at 12 noon.     No facility-administered medications prior to visit.    No Known Allergies     Objective:    BP 100/63 (BP Location: Left Arm, Patient Position: Sitting, Cuff Size: Normal)   Pulse 75   Temp 98.4 F (36.9 C) (Oral)   Resp 16   Wt 127 lb 11.2 oz (57.9 kg)   LMP 05/25/2022 (Exact Date) Comment: spotting (mild)  SpO2 99%   BMI 23.36 kg/m  Wt Readings from Last 3 Encounters:  05/27/22 127 lb 11.2 oz (57.9 kg)  12/18/21 131 lb (59.4 kg)  11/20/21 151 lb 3.8 oz (68.6 kg)    Physical Exam Vitals and nursing note reviewed.  Constitutional:      Appearance:  She is well-developed.  HENT:     Head: Normocephalic and atraumatic.  Cardiovascular:     Rate and Rhythm: Normal rate and regular rhythm.     Heart sounds: Normal heart sounds. No murmur heard.    No friction rub. No gallop.  Pulmonary:     Effort: Pulmonary effort is normal. No tachypnea or respiratory distress.     Breath sounds: Normal breath sounds. No decreased breath sounds, wheezing, rhonchi or rales.  Chest:     Chest wall: No tenderness.  Abdominal:     General: Bowel sounds are normal.     Palpations: Abdomen is soft.   Musculoskeletal:        General: Normal range of motion.     Cervical back: Normal range of motion.  Skin:    General: Skin is warm and dry.  Neurological:     Mental Status: She is alert and oriented to person, place, and time.     Coordination: Coordination normal.  Psychiatric:        Behavior: Behavior normal. Behavior is cooperative.        Thought Content: Thought content normal.        Judgment: Judgment normal.          Patient has been counseled extensively about nutrition and exercise as well as the importance of adherence with medications and regular follow-up. The patient was given clear instructions to go to ER or return to medical center if symptoms don't improve, worsen or new problems develop. The patient verbalized understanding.   Follow-up: Return for physical in October.   Claiborne Rigg, FNP-BC [facility] [place], [place] [phone]   05/27/2022, 2:03 PM

## 2022-05-28 ENCOUNTER — Ambulatory Visit: Payer: Self-pay | Admitting: Obstetrics and Gynecology

## 2022-05-28 LAB — CBC WITH DIFFERENTIAL/PLATELET
Basophils Absolute: 0 10*3/uL (ref 0.0–0.2)
Basos: 0 %
EOS (ABSOLUTE): 0.1 10*3/uL (ref 0.0–0.4)
Eos: 1 %
Hematocrit: 37.8 % (ref 34.0–46.6)
Hemoglobin: 13.1 g/dL (ref 11.1–15.9)
Immature Grans (Abs): 0 10*3/uL (ref 0.0–0.1)
Immature Granulocytes: 0 %
Lymphocytes Absolute: 2.7 10*3/uL (ref 0.7–3.1)
Lymphs: 53 %
MCH: 31.6 pg (ref 26.6–33.0)
MCHC: 34.7 g/dL (ref 31.5–35.7)
MCV: 91 fL (ref 79–97)
Monocytes Absolute: 0.3 10*3/uL (ref 0.1–0.9)
Monocytes: 5 %
Neutrophils Absolute: 2.1 10*3/uL (ref 1.4–7.0)
Neutrophils: 41 %
Platelets: 282 10*3/uL (ref 150–450)
RBC: 4.14 x10E6/uL (ref 3.77–5.28)
RDW: 12.8 % (ref 11.7–15.4)
WBC: 5.1 10*3/uL (ref 3.4–10.8)

## 2022-05-28 LAB — BASIC METABOLIC PANEL
BUN/Creatinine Ratio: 18 (ref 9–23)
BUN: 11 mg/dL (ref 6–20)
CO2: 24 mmol/L (ref 20–29)
Calcium: 9.4 mg/dL (ref 8.7–10.2)
Chloride: 106 mmol/L (ref 96–106)
Creatinine, Ser: 0.61 mg/dL (ref 0.57–1.00)
Glucose: 83 mg/dL (ref 70–99)
Potassium: 4 mmol/L (ref 3.5–5.2)
Sodium: 142 mmol/L (ref 134–144)
eGFR: 121 mL/min/{1.73_m2} (ref >59)

## 2022-06-03 ENCOUNTER — Ambulatory Visit: Payer: Self-pay | Admitting: Family Medicine

## 2022-06-04 ENCOUNTER — Encounter: Payer: Self-pay | Admitting: Family Medicine

## 2022-06-04 ENCOUNTER — Ambulatory Visit: Payer: Self-pay | Admitting: *Deleted

## 2022-06-04 ENCOUNTER — Other Ambulatory Visit (HOSPITAL_COMMUNITY)
Admission: RE | Admit: 2022-06-04 | Discharge: 2022-06-04 | Disposition: A | Discharge status: Home / Self Care | Payer: 59 | Source: Ambulatory Visit | Attending: Obstetrics and Gynecology | Admitting: Obstetrics and Gynecology

## 2022-06-04 ENCOUNTER — Ambulatory Visit (INDEPENDENT_AMBULATORY_CARE_PROVIDER_SITE_OTHER): Payer: 59 | Admitting: Family Medicine

## 2022-06-04 VITALS — BP 106/74 | HR 64 | Wt 126.0 lb

## 2022-06-04 VITALS — BP 106/64 | Wt 126.6 lb

## 2022-06-04 DIAGNOSIS — R87612 Low grade squamous intraepithelial lesion on cytologic smear of cervix (LGSIL): Secondary | ICD-10-CM | POA: Diagnosis not present

## 2022-06-04 DIAGNOSIS — Z1239 Encounter for other screening for malignant neoplasm of breast: Secondary | ICD-10-CM

## 2022-06-04 LAB — POCT PREGNANCY, URINE: Preg Test, Ur: NEGATIVE

## 2022-06-04 NOTE — Progress Notes (Signed)
    GYNECOLOGY OFFICE COLPOSCOPY PROCEDURE NOTE  31 y.o. G1P1001 here for colposcopy for low-grade squamous intraepithelial neoplasia (LGSIL - encompassing HPV,mild dysplasia,CIN I) pap smear on 03/24/2022. Discussed role for HPV in cervical dysplasia, need for surveillance.  Patient gave informed written consent, time out was performed.  Placed in lithotomy position. Cervix viewed with speculum and colposcope after application of acetic acid.   Colposcopy adequate? Yes  acetowhite lesion(s) noted at SCJ 10, 2, 4, 6  o'clock; biopsies obtained.  ECC specimen obtained. All specimens were labeled and sent to pathology.  Chaperone was present during entire procedure.  Patient was given post procedure instructions.  Will follow up pathology and manage accordingly; patient will be contacted with results and recommendations.  Routine preventative health maintenance measures emphasized.  Reva Bores, MD 06/04/2022 3:12 PM

## 2022-06-04 NOTE — Progress Notes (Signed)
[name] is a 31 y.o. female who presents to [facility] clinic today with no complaints. Patient had a Pap smear completed 03/24/2022 that was abnormal that a colposcopy is recommended for follow up.   Pap Smear: Pap smear not completed today. Last Pap smear was 03/24/2022 at the free cervical cancer screening clinic and was abnormal - LSIL with positive HPV . Per patient has no history of an abnormal Pap smear prior to her most recent Pap smear. Last Pap smear result is available in Epic.   Physical exam: Breasts Breasts symmetrical. No skin abnormalities bilateral breasts. No nipple retraction bilateral breasts. No nipple discharge bilateral breasts. No lymphadenopathy. No lumps palpated bilateral breasts. No complaints of pain or tenderness on exam. Screening mammogram recommended at age 47 unless clinically indicated prior.      Pelvic/Bimanual Pap is not indicated today per BCCCP guidelines.   Smoking History: Patient has never smoked.   Patient Navigation: Patient education provided. Access to services provided for patient through [place] program. Spanish interpreter [name] from [facility] provided. Patient has food insecurities. Patient escorted to the [facility] for [employer] market for groceries.     Breast and Cervical Cancer Risk Assessment: Patient does not have family history of breast cancer, known genetic mutations, or radiation treatment to the chest before age 3. Patient does not have history of cervical dysplasia, immunocompromised, or DES exposure in-utero. Breast cancer risk assessment completed. No breast cancer risk calculated due to patient is less than 32 years old.  Risk Assessment     Risk Scores       06/04/2022   Last edited by: Narda Rutherford, LPN   5-year risk:    Lifetime risk:             A: BCCCP exam without pap smear No complaints.  P: Referred patient to the [facility] for a colposcopy to  follow up for her abnormal Pap smear. Appointment scheduled Thursday, June 04, 2022 at 1355.  Priscille Heidelberg, RN 06/04/2022 12:44 PM

## 2022-06-04 NOTE — Patient Instructions (Addendum)
Explained breast self awareness with Amber Torres. Patient did not need a Pap smear today due to last Pap smear was 03/24/2022. Explained the colposcopy the recommended follow up for her abnormal Pap smear. Referred patient to the [facility] for a colposcopy to follow up for her abnormal Pap smear. Appointment scheduled Thursday, June 04, 2022 at 1355. Patient aware of appointment and will be there. Let patient know a screening mammogram is recommended at age 61 unless clinically indicated prior. Amber Torres verbalized understanding.  Blaize Nipper, Kathaleen Maser, RN 12:44 PM

## 2022-06-08 LAB — SURGICAL PATHOLOGY

## 2022-06-12 ENCOUNTER — Telehealth: Payer: Self-pay | Admitting: General Practice

## 2022-06-12 NOTE — Telephone Encounter (Signed)
Per Dr Shawnie Pons, patient needs repeat pap in 1 year. Called patient with Byrd Hesselbach assisting with spanish interpretation & informed her of colposcopy results and follow up recommendations. Patient verbalized understanding.

## 2022-06-16 ENCOUNTER — Telehealth: Payer: Self-pay | Admitting: Nurse Practitioner

## 2022-06-16 ENCOUNTER — Telehealth: Payer: Self-pay | Admitting: Emergency Medicine

## 2022-06-16 NOTE — Telephone Encounter (Signed)
Copied from CRM [id number]. Topic: General - Call Back - No Documentation >> Jun 16, 2022 12:45 PM Franchot Heidelberg wrote: Reason for CRM: Pt called to discuss recent test results, best contact: [phone]

## 2022-06-16 NOTE — Telephone Encounter (Signed)
Copied from CRM [id number]. Topic: General - Call Back - No Documentation >> Jun 16, 2022 12:45 PM Franchot Heidelberg wrote: Reason for CRM: Pt called to discuss recent test results, best contact: [phone] >> Jun 16, 2022  2:54 PM Ja-Kwan M wrote: Pt called once again requesting recent test results. Cb# [phone]

## 2022-06-17 NOTE — Telephone Encounter (Signed)
Pt aware and voiced understanding of results.

## 2022-06-17 NOTE — Telephone Encounter (Signed)
Pt aware and voiced understanding of results. 

## 2022-08-31 ENCOUNTER — Encounter: Payer: Self-pay | Admitting: Nurse Practitioner

## 2022-08-31 ENCOUNTER — Ambulatory Visit: Payer: Self-pay | Attending: Nurse Practitioner | Admitting: Nurse Practitioner

## 2022-08-31 VITALS — BP 101/67 | HR 68 | Temp 98.2°F | Ht 62.0 in | Wt 125.4 lb

## 2022-08-31 DIAGNOSIS — Z0000 Encounter for general adult medical examination without abnormal findings: Secondary | ICD-10-CM

## 2022-08-31 NOTE — Progress Notes (Signed)
Assessment & Plan:  Amber Torres was seen today for annual exam.  Diagnoses and all orders for this visit:  Encounter for annual physical exam Follow up in one year    Patient has been counseled on age-appropriate routine health concerns for screening and prevention. These are reviewed and up-to-date. Referrals have been placed accordingly. Immunizations are up-to-date or declined.    Subjective:   Chief Complaint  Patient presents with   Annual Exam   HPI Amber Torres 31 y.o. female presents to office today for annual physical  She has a past medical history of abnormal PAP with Colposcopy 06-04-2022  PAP SMEAR 03-24-2022  Review of Systems  Constitutional:  Negative for fever, malaise/fatigue and weight loss.  HENT: Negative.  Negative for nosebleeds.   Eyes: Negative.  Negative for blurred vision, double vision and photophobia.  Respiratory: Negative.  Negative for cough and shortness of breath.   Cardiovascular: Negative.  Negative for chest pain, palpitations and leg swelling.  Gastrointestinal: Negative.  Negative for heartburn, nausea and vomiting.  Genitourinary: Negative.   Musculoskeletal: Negative.  Negative for myalgias.  Skin: Negative.   Neurological: Negative.  Negative for dizziness, focal weakness, seizures and headaches.  Endo/Heme/Allergies: Negative.   Psychiatric/Behavioral: Negative.  Negative for suicidal ideas.     Past Medical History:  Diagnosis Date   Medical history non-contributory    Normal labor 11/20/2021   Supervision of normal first pregnancy, antepartum 10/13/2021    Nursing Staff Provider Office Location  [facility] Dating  Early ultrasound Language  Spanish Anatomy US  Low-Lying Anterior Placenta>>RESOLVED Flu Vaccine  Letter given for [facility] 10/15/21 Genetic/Carrier Screen  NIPS:   LR Boy AFP:   Declined Horizon: Negative TDaP Vaccine   Letter given for [facility] 10/15/21 Hgb A1C or  GTT Early 5.5 Third trimester 1 hour-pass COVID  Vaccine 2 doses     Vaginal delivery 11/20/2021    Past Surgical History:  Procedure Laterality Date   COLONOSCOPY  2021   NO PAST SURGERIES      History reviewed. No pertinent family history.  Social History Reviewed with no changes to be made today.   Outpatient Medications Prior to Visit  Medication Sig Dispense Refill   acetaminophen (TYLENOL) 325 MG tablet Take 3 tablets (975 mg total) by mouth every 8 (eight) hours as needed (pain). 60 tablet 0   ibuprofen (ADVIL) 600 MG tablet Take 1 tablet (600 mg total) by mouth every 6 (six) hours as needed (pain). 40 tablet 0   norethindrone (MICRONOR) 0.35 MG tablet Take 1 tablet (0.35 mg total) by mouth daily. For birth control. 28 tablet 11   Prenatal Vit-Fe Fumarate-FA (MULTIVITAMIN-PRENATAL) 27-0.8 MG TABS tablet Take 1 tablet by mouth daily at 12 noon.     No facility-administered medications prior to visit.    No Known Allergies     Objective:    BP 101/67   Pulse 68   Temp 98.2 F (36.8 C) (Temporal)   Ht 5\' 2"  (1.575 m)   Wt 125 lb 6.4 oz (56.9 kg)   LMP 08/08/2022 (Approximate)   SpO2 97%   BMI 22.94 kg/m  Wt Readings from Last 3 Encounters:  08/31/22 125 lb 6.4 oz (56.9 kg)  06/04/22 126 lb (57.2 kg)  06/04/22 126 lb 9.6 oz (57.4 kg)    Physical Exam Constitutional:      Appearance: She is well-developed.  HENT:     Head: Normocephalic and atraumatic.     Right Ear: Hearing,  tympanic membrane, ear canal and external ear normal.     Left Ear: Hearing, tympanic membrane, ear canal and external ear normal.     Nose: Nose normal.     Right Turbinates: Not enlarged.     Left Turbinates: Not enlarged.     Mouth/Throat:     Lips: Pink.     Mouth: Mucous membranes are moist.     Dentition: No dental tenderness, gingival swelling, dental abscesses or gum lesions.     Pharynx: No oropharyngeal exudate.  Eyes:     General: No scleral icterus.       Right eye: No discharge.     Extraocular Movements: Extraocular  movements intact.     Conjunctiva/sclera: Conjunctivae normal.     Pupils: Pupils are equal, round, and reactive to light.  Neck:     Thyroid: No thyromegaly.     Trachea: No tracheal deviation.  Cardiovascular:     Rate and Rhythm: Normal rate and regular rhythm.     Heart sounds: Normal heart sounds. No murmur heard.    No friction rub.  Pulmonary:     Effort: Pulmonary effort is normal. No accessory muscle usage or respiratory distress.     Breath sounds: Normal breath sounds. No decreased breath sounds, wheezing, rhonchi or rales.  Abdominal:     General: Bowel sounds are normal. There is no distension.     Palpations: Abdomen is soft. There is no mass.     Tenderness: There is no abdominal tenderness. There is no right CVA tenderness, left CVA tenderness, guarding or rebound.     Hernia: No hernia is present.  Musculoskeletal:        General: No tenderness or deformity. Normal range of motion.     Cervical back: Normal range of motion and neck supple.  Lymphadenopathy:     Cervical: No cervical adenopathy.  Skin:    General: Skin is warm and dry.     Findings: No erythema.  Neurological:     Mental Status: She is alert and oriented to person, place, and time.     Cranial Nerves: No cranial nerve deficit.     Motor: Motor function is intact.     Coordination: Coordination is intact. Coordination normal.     Gait: Gait is intact.     Deep Tendon Reflexes:     Reflex Scores:      Patellar reflexes are 1+ on the right side and 1+ on the left side. Psychiatric:        Attention and Perception: Attention normal.        Mood and Affect: Mood normal.        Speech: Speech normal.        Behavior: Behavior normal.        Thought Content: Thought content normal.        Judgment: Judgment normal.          Patient has been counseled extensively about nutrition and exercise as well as the importance of adherence with medications and regular follow-up. The patient was given  clear instructions to go to ER or return to medical center if symptoms don't improve, worsen or new problems develop. The patient verbalized understanding.   Follow-up: Return if symptoms worsen or fail to improve.   Gildardo Pounds, FNP-BC [facility] [place], [place]   08/31/2022, 2:38 PM
